# Patient Record
Sex: Male | Born: 1950
Health system: Southern US, Community
[De-identification: ages and names within clinical notes are randomized; demographics above are authoritative.]

## PROBLEM LIST (undated history)

## (undated) DIAGNOSIS — G4730 Sleep apnea, unspecified: Secondary | ICD-10-CM

## (undated) DIAGNOSIS — F419 Anxiety disorder, unspecified: Secondary | ICD-10-CM

## (undated) DIAGNOSIS — M1990 Unspecified osteoarthritis, unspecified site: Secondary | ICD-10-CM

## (undated) DIAGNOSIS — E785 Hyperlipidemia, unspecified: Secondary | ICD-10-CM

## (undated) DIAGNOSIS — I10 Essential (primary) hypertension: Secondary | ICD-10-CM

## (undated) DIAGNOSIS — F329 Major depressive disorder, single episode, unspecified: Secondary | ICD-10-CM

## (undated) DIAGNOSIS — F32A Depression, unspecified: Secondary | ICD-10-CM

## (undated) HISTORY — DX: Hyperlipidemia, unspecified: E78.5

## (undated) HISTORY — PX: KNEE ARTHROSCOPY: SHX127

## (undated) HISTORY — DX: Essential (primary) hypertension: I10

## (undated) HISTORY — PX: OTHER SURGICAL HISTORY: SHX169

---

## 1998-09-09 ENCOUNTER — Inpatient Hospital Stay (HOSPITAL_COMMUNITY): Admission: EM | Admit: 1998-09-09 | Discharge: 1998-09-11 | Payer: Self-pay

## 1998-09-09 ENCOUNTER — Encounter: Payer: Self-pay | Admitting: Internal Medicine

## 2000-10-23 ENCOUNTER — Emergency Department (HOSPITAL_COMMUNITY): Admission: EM | Admit: 2000-10-23 | Discharge: 2000-10-23 | Payer: Self-pay | Admitting: Emergency Medicine

## 2010-08-30 ENCOUNTER — Emergency Department (HOSPITAL_COMMUNITY)
Admission: EM | Admit: 2010-08-30 | Discharge: 2010-08-30 | Disposition: A | Discharge status: Home / Self Care | Payer: BC Managed Care – PPO | Attending: Emergency Medicine | Admitting: Emergency Medicine

## 2010-08-30 DIAGNOSIS — R319 Hematuria, unspecified: Secondary | ICD-10-CM | POA: Insufficient documentation

## 2010-08-30 DIAGNOSIS — R300 Dysuria: Secondary | ICD-10-CM | POA: Insufficient documentation

## 2010-08-30 DIAGNOSIS — Z0000 Encounter for general adult medical examination without abnormal findings: Secondary | ICD-10-CM | POA: Insufficient documentation

## 2010-08-30 DIAGNOSIS — F341 Dysthymic disorder: Secondary | ICD-10-CM | POA: Insufficient documentation

## 2010-08-30 DIAGNOSIS — I10 Essential (primary) hypertension: Secondary | ICD-10-CM | POA: Insufficient documentation

## 2010-08-30 LAB — CBC
HCT: 46.5 % (ref 39.0–52.0)
Hemoglobin: 15.9 g/dL (ref 13.0–17.0)
MCH: 29.3 pg (ref 26.0–34.0)
MCHC: 34.2 g/dL (ref 30.0–36.0)
MCV: 85.8 fL (ref 78.0–100.0)
Platelets: 345 10*3/uL (ref 150–400)
RBC: 5.42 MIL/uL (ref 4.22–5.81)
RDW: 13.8 % (ref 11.5–15.5)
WBC: 9.9 10*3/uL (ref 4.0–10.5)

## 2010-08-30 LAB — DIFFERENTIAL
Basophils Absolute: 0 10*3/uL (ref 0.0–0.1)
Basophils Relative: 0 % (ref 0–1)
Eosinophils Absolute: 0.1 10*3/uL (ref 0.0–0.7)
Eosinophils Relative: 1 % (ref 0–5)
Lymphocytes Relative: 30 % (ref 12–46)
Lymphs Abs: 3 10*3/uL (ref 0.7–4.0)
Monocytes Absolute: 0.7 10*3/uL (ref 0.1–1.0)
Monocytes Relative: 7 % (ref 3–12)
Neutro Abs: 6.1 10*3/uL (ref 1.7–7.7)
Neutrophils Relative %: 62 % (ref 43–77)

## 2010-08-30 LAB — URINALYSIS, ROUTINE W REFLEX MICROSCOPIC
Bilirubin Urine: NEGATIVE
Glucose, UA: NEGATIVE mg/dL
Hgb urine dipstick: NEGATIVE
Ketones, ur: 15 mg/dL — AB
Nitrite: NEGATIVE
Protein, ur: NEGATIVE mg/dL
Specific Gravity, Urine: 1.02 (ref 1.005–1.030)
Urobilinogen, UA: 1 mg/dL (ref 0.0–1.0)
pH: 5.5 (ref 5.0–8.0)

## 2010-08-30 LAB — BASIC METABOLIC PANEL
BUN: 8 mg/dL (ref 6–23)
CO2: 24 mEq/L (ref 19–32)
Calcium: 9.5 mg/dL (ref 8.4–10.5)
Chloride: 104 mEq/L (ref 96–112)
Creatinine, Ser: 1.23 mg/dL (ref 0.4–1.5)
GFR calc Af Amer: >60 mL/min (ref >60)
GFR calc non Af Amer: >60 mL/min (ref >60)
Glucose, Bld: 139 mg/dL — ABNORMAL HIGH (ref 70–99)
Potassium: 3.9 mEq/L (ref 3.5–5.1)
Sodium: 139 mEq/L (ref 135–145)

## 2011-12-14 ENCOUNTER — Encounter: Payer: BC Managed Care – PPO | Attending: Obstetrics and Gynecology | Admitting: *Deleted

## 2011-12-14 DIAGNOSIS — Z713 Dietary counseling and surveillance: Secondary | ICD-10-CM | POA: Insufficient documentation

## 2011-12-14 DIAGNOSIS — E119 Type 2 diabetes mellitus without complications: Secondary | ICD-10-CM | POA: Insufficient documentation

## 2011-12-15 ENCOUNTER — Encounter: Payer: Self-pay | Admitting: *Deleted

## 2011-12-15 NOTE — Progress Notes (Signed)
  Patient was seen on 12/14/2011 for the first of a series of three diabetes self-management courses at the [facility].  A1c on 8/13 = 6.9%  The following learning objectives were met by the patient during this course:   Defines the role of glucose and insulin  Identifies type of diabetes and pathophysiology  Defines the diagnostic criteria for diabetes and prediabetes  States the risk factors for Type 2 Diabetes  States the symptoms of Type 2 Diabetes  Defines Type 2 Diabetes treatment goals  Defines Type 2 Diabetes treatment options  States the rationale for glucose monitoring  Identifies A1C, glucose targets, and testing times  Identifies proper sharps disposal  Defines the purpose of a diabetes food plan  Identifies carbohydrate food groups  Defines effects of carbohydrate foods on glucose levels  Identifies carbohydrate choices/grams/food labels  States benefits of physical activity and effect on glucose  Review of suggested activity guidelines  Handouts given during class include:  Type 2 Diabetes: Basics Book  My Food Plan Book  Food and Activity Log  Follow-Up Plan: Core Class 2

## 2011-12-15 NOTE — Patient Instructions (Signed)
Goals:  Follow Diabetes Meal Plan as instructed  Eat 3 meals and 2 snacks, every 3-5 hrs  Limit carbohydrate intake to 60 grams carbohydrate/meal  Limit carbohydrate intake to 0-30 grams carbohydrate/snack  Add lean protein foods to meals/snacks  Monitor glucose levels as instructed by your doctor  Aim for 15-30 mins of physical activity daily as tolerated  Bring food record and glucose log to your next nutrition visit 

## 2012-01-04 ENCOUNTER — Encounter: Payer: BC Managed Care – PPO | Attending: Obstetrics and Gynecology | Admitting: *Deleted

## 2012-01-04 DIAGNOSIS — E119 Type 2 diabetes mellitus without complications: Secondary | ICD-10-CM

## 2012-01-04 DIAGNOSIS — Z713 Dietary counseling and surveillance: Secondary | ICD-10-CM | POA: Insufficient documentation

## 2012-01-05 ENCOUNTER — Encounter: Payer: Self-pay | Admitting: *Deleted

## 2012-01-05 NOTE — Progress Notes (Signed)
  Patient was seen on 01/04/12 for the second of a series of three diabetes self-management courses at the [facility]. The following learning objectives were met by the patient during this course:   Explain basic nutrition maintenance and quality assurance  Describe causes, symptoms and treatment of hypoglycemia and hyperglycemia  Explain how to manage diabetes during illness  Describe the importance of good nutrition for health and healthy eating strategies  List strategies to follow meal plan when dining out  Describe the effects of alcohol on glucose and how to use it safely  Describe problem solving skills for day-to-day glucose challenges  Describe strategies to use when treatment plan needs to change  Identify important factors involved in successful weight loss  Describe ways to remain physically active  Describe the impact of regular activity on insulin resistance   Handouts given in class:  Refrigerator magnet for Sick Day Guidelines  [facility] Oral medication/insulin handout  Follow-Up Plan: Patient will attend the final class of the ADA Diabetes Self-Care Education.   

## 2012-01-05 NOTE — Patient Instructions (Signed)
Goals:  Follow Diabetes Meal Plan as instructed  Eat 3 meals and 2 snacks, every 3-5 hrs  Limit carbohydrate intake to 45-60 grams carbohydrate/meal  Limit carbohydrate intake to 15-30 grams carbohydrate/snack  Add lean protein foods to meals/snacks  Monitor glucose levels as instructed by your doctor  Aim for 30 mins of physical activity daily  Bring food record and glucose log to your next nutrition visit 

## 2012-01-18 ENCOUNTER — Encounter: Payer: BC Managed Care – PPO | Admitting: Dietician

## 2012-01-18 DIAGNOSIS — E119 Type 2 diabetes mellitus without complications: Secondary | ICD-10-CM

## 2012-01-21 NOTE — Progress Notes (Signed)
  Patient was seen on 01/18/2012 for the third of a series of three diabetes self-management courses at the [facility]. The following learning objectives were met by the patient during this course:    Describe how diabetes changes over time   Identify diabetes complications and ways to prevent them   Describe strategies that can promote heart health including lowering blood pressure and cholesterol   Describe strategies to lower dietary fat and sodium in the diet   Identify physical activities that benefit cardiovascular health   Evaluate success in meeting personal goal   Describe the belief that they can live successfully with diabetes day to day   Establish 2-3 goals that they will plan to diligently work on until they return for the free 57-month follow-up visit  The following handouts were given in class:  3 Month Follow Up Visit handout  Goal setting handout  [facility] Stress Management Handout  Class evaluation form  Your patient has established the following 3 month goals for diabetes self-care:  Count Carbohydrates at most of my meals and snacks  Test my glucose 3 times a day 7 days a week   Increase my activity.  Follow-Up Plan: Patient will attend a 3 month follow-up visit for diabetes self-management education.

## 2012-04-17 ENCOUNTER — Ambulatory Visit: Payer: BC Managed Care – PPO | Admitting: Dietician

## 2012-04-18 ENCOUNTER — Ambulatory Visit: Payer: BC Managed Care – PPO | Admitting: Dietician

## 2013-10-04 ENCOUNTER — Ambulatory Visit (HOSPITAL_BASED_OUTPATIENT_CLINIC_OR_DEPARTMENT_OTHER): Payer: BC Managed Care – PPO | Attending: Family Medicine

## 2013-10-04 VITALS — Ht 72.0 in | Wt 240.0 lb

## 2013-10-04 DIAGNOSIS — G4710 Hypersomnia, unspecified: Secondary | ICD-10-CM | POA: Insufficient documentation

## 2013-10-04 DIAGNOSIS — G4733 Obstructive sleep apnea (adult) (pediatric): Secondary | ICD-10-CM

## 2013-10-04 DIAGNOSIS — R0989 Other specified symptoms and signs involving the circulatory and respiratory systems: Secondary | ICD-10-CM | POA: Insufficient documentation

## 2013-10-04 DIAGNOSIS — G4730 Sleep apnea, unspecified: Principal | ICD-10-CM

## 2013-10-04 DIAGNOSIS — R0609 Other forms of dyspnea: Secondary | ICD-10-CM | POA: Insufficient documentation

## 2013-10-04 DIAGNOSIS — I4949 Other premature depolarization: Secondary | ICD-10-CM | POA: Insufficient documentation

## 2013-10-04 DIAGNOSIS — G4761 Periodic limb movement disorder: Secondary | ICD-10-CM | POA: Insufficient documentation

## 2013-10-07 DIAGNOSIS — G4733 Obstructive sleep apnea (adult) (pediatric): Secondary | ICD-10-CM

## 2013-10-07 DIAGNOSIS — G4730 Sleep apnea, unspecified: Secondary | ICD-10-CM

## 2013-10-07 DIAGNOSIS — G4710 Hypersomnia, unspecified: Secondary | ICD-10-CM

## 2013-10-07 NOTE — Sleep Study (Signed)
   NAME: Timothy Hammond DATE OF BIRTH:  04-Jan-1951 MEDICAL RECORD NUMBER [id number]  [facility]  PHYSICIAN: Clinton D Young  DATE OF STUDY: 10/04/2013  SLEEP STUDY TYPE: Nocturnal Polysomnogram               REFERRING PHYSICIAN: Massie Maroon, FNP  INDICATION FOR STUDY: Hypersomnia with sleep apnea and  EPWORTH SLEEPINESS SCORE:   7/24 and HEIGHT: 6' (182.9 cm)  WEIGHT: 108.863 kg (240 lb)    Body mass index is 32.54 kg/(m^2).  NECK SIZE: 17 in.  MEDICATIONS: Charted for review  SLEEP ARCHITECTURE: Split study protocol. During the diagnostic phase, total sleep time 126 minutes with sleep efficiency 75.9%. Stage I was 18.3%, stage II 81.7%, stage III and REM were absent. Sleep latency 0.5 minutes, awake after sleep onset 39.5 minutes, arousal index 26.2, bedtime medication: Zolpidem. Sleep was fragmented by frequent brief intervals of awakening.  RESPIRATORY DATA: Apnea hypopneas index (AHI) 51.4 per hour. 108 total events scored including 48 obstructive apneas and 60 hypopneas. Events were not positional. CPAP was titrated to 11 CWP, AHI 0 per hour. He wore a Simplus fullface mask, size large, with heated humidifier.  OXYGEN DATA: Loud snoring before CPAP with oxygen desaturation to a nadir of 83% on room air. With CPAP control, snoring was prevented and mean oxygen saturation of 92.1% on room air.  CARDIAC DATA: Sinus rhythm with PVCs  MOVEMENT/PARASOMNIA: Before CPAP 67 limb jerks were counted of which 3 were associated with arousal or wakening for a periodic limb movement with arousal index of 1.4 per hour. During CPAP titration, 71 limb jerks were counted but none were noted to affect  sleep quality. Bathroom x2  IMPRESSION/ RECOMMENDATION:   1) Severe obstructive sleep apnea/hypopnea syndrome, AHI 51.4 per hour with non-positional events. Loud snoring with oxygen desaturation to a nadir of 83% on room air. 2) Successful CPAP titration to 11  CWP, AHI 0 per hour. He wore a large [employer] Simplus fullface mask with heated humidifier. Snoring was prevented and mean oxygen saturation held 92.1% on room air with CPAP.  Signed Jetty Duhamel M.D. Waymon Budge Diplomate, Biomedical engineer of Sleep Medicine  ELECTRONICALLY SIGNED ON:  10/07/2013, 4:05 PM  [facility] PH: [phone]   FX: [phone] ACCREDITED BY THE AMERICAN ACADEMY OF SLEEP MEDICINE

## 2014-09-26 ENCOUNTER — Other Ambulatory Visit: Payer: Self-pay | Admitting: Orthopaedic Surgery

## 2014-10-31 ENCOUNTER — Inpatient Hospital Stay (HOSPITAL_COMMUNITY): Admission: RE | Admit: 2014-10-31 | Payer: Self-pay | Source: Ambulatory Visit

## 2014-11-12 ENCOUNTER — Encounter (HOSPITAL_COMMUNITY): Admission: RE | Payer: Self-pay | Source: Ambulatory Visit

## 2014-11-12 ENCOUNTER — Inpatient Hospital Stay (HOSPITAL_COMMUNITY)
Admission: RE | Admit: 2014-11-12 | Payer: BLUE CROSS/BLUE SHIELD | Source: Ambulatory Visit | Admitting: Orthopaedic Surgery

## 2014-11-12 SURGERY — ARTHROPLASTY, KNEE, TOTAL
Anesthesia: Spinal | Laterality: Right

## 2015-01-30 ENCOUNTER — Other Ambulatory Visit: Payer: Self-pay | Admitting: Orthopaedic Surgery

## 2015-03-10 ENCOUNTER — Encounter (HOSPITAL_COMMUNITY): Payer: Self-pay

## 2015-03-10 ENCOUNTER — Encounter (HOSPITAL_COMMUNITY)
Admission: RE | Admit: 2015-03-10 | Discharge: 2015-03-10 | Disposition: A | Discharge status: Home / Self Care | Payer: Medicare Other | Source: Ambulatory Visit | Attending: Orthopaedic Surgery | Admitting: Orthopaedic Surgery

## 2015-03-10 DIAGNOSIS — Z01818 Encounter for other preprocedural examination: Secondary | ICD-10-CM | POA: Diagnosis present

## 2015-03-10 DIAGNOSIS — M179 Osteoarthritis of knee, unspecified: Secondary | ICD-10-CM | POA: Diagnosis not present

## 2015-03-10 HISTORY — DX: Sleep apnea, unspecified: G47.30

## 2015-03-10 HISTORY — DX: Major depressive disorder, single episode, unspecified: F32.9

## 2015-03-10 HISTORY — DX: Anxiety disorder, unspecified: F41.9

## 2015-03-10 HISTORY — DX: Unspecified osteoarthritis, unspecified site: M19.90

## 2015-03-10 HISTORY — DX: Depression, unspecified: F32.A

## 2015-03-10 LAB — URINALYSIS, ROUTINE W REFLEX MICROSCOPIC
BILIRUBIN URINE: NEGATIVE
GLUCOSE, UA: NEGATIVE mg/dL
HGB URINE DIPSTICK: NEGATIVE
Ketones, ur: NEGATIVE mg/dL
Leukocytes, UA: NEGATIVE
Nitrite: NEGATIVE
PROTEIN: NEGATIVE mg/dL
Specific Gravity, Urine: 1.017 (ref 1.005–1.030)
Urobilinogen, UA: 0.2 mg/dL (ref 0.0–1.0)
pH: 5 (ref 5.0–8.0)

## 2015-03-10 LAB — GLUCOSE, CAPILLARY: Glucose-Capillary: 183 mg/dL — ABNORMAL HIGH (ref 65–99)

## 2015-03-10 LAB — PROTIME-INR
INR: 1.09 (ref 0.00–1.49)
Prothrombin Time: 14.3 seconds (ref 11.6–15.2)

## 2015-03-10 LAB — CBC WITH DIFFERENTIAL/PLATELET
BASOS ABS: 0 10*3/uL (ref 0.0–0.1)
Basophils Relative: 0 %
Eosinophils Absolute: 0.3 10*3/uL (ref 0.0–0.7)
Eosinophils Relative: 3 %
HEMATOCRIT: 43.7 % (ref 39.0–52.0)
HEMOGLOBIN: 14.6 g/dL (ref 13.0–17.0)
LYMPHS PCT: 33 %
Lymphs Abs: 2.9 10*3/uL (ref 0.7–4.0)
MCH: 29.7 pg (ref 26.0–34.0)
MCHC: 33.4 g/dL (ref 30.0–36.0)
MCV: 89 fL (ref 78.0–100.0)
Monocytes Absolute: 0.6 10*3/uL (ref 0.1–1.0)
Monocytes Relative: 7 %
NEUTROS ABS: 5.2 10*3/uL (ref 1.7–7.7)
NEUTROS PCT: 57 %
PLATELETS: 349 10*3/uL (ref 150–400)
RBC: 4.91 MIL/uL (ref 4.22–5.81)
RDW: 13.8 % (ref 11.5–15.5)
WBC: 9 10*3/uL (ref 4.0–10.5)

## 2015-03-10 LAB — BASIC METABOLIC PANEL
ANION GAP: 9 (ref 5–15)
BUN: 13 mg/dL (ref 6–20)
CHLORIDE: 105 mmol/L (ref 101–111)
CO2: 27 mmol/L (ref 22–32)
Calcium: 9.5 mg/dL (ref 8.9–10.3)
Creatinine, Ser: 1.22 mg/dL (ref 0.61–1.24)
GFR calc Af Amer: >60 mL/min (ref >60)
Glucose, Bld: 165 mg/dL — ABNORMAL HIGH (ref 65–99)
POTASSIUM: 4.3 mmol/L (ref 3.5–5.1)
SODIUM: 141 mmol/L (ref 135–145)

## 2015-03-10 LAB — ABO/RH: ABO/RH(D): O POS

## 2015-03-10 LAB — APTT: APTT: 34 s (ref 24–37)

## 2015-03-10 LAB — TYPE AND SCREEN
ABO/RH(D): O POS
ANTIBODY SCREEN: NEGATIVE

## 2015-03-10 LAB — SURGICAL PCR SCREEN
MRSA, PCR: NEGATIVE
Staphylococcus aureus: POSITIVE — AB

## 2015-03-10 NOTE — Pre-Procedure Instructions (Signed)
Oretha Milchercy D Dempsey  03/10/2015      [facility] - [place] ([place]), [place] - [address] [place] [id number] Phone: [phone] Fax: [phone]    Your procedure is scheduled on Tues, Nov 15 @ 7:30 AM  [facility]  Call this number if you have problems the morning of surgery:  [phone]   Remember:  Do not eat food or drink liquids after midnight.  Take these medicines the morning of surgery with A SIP OF WATER Alprazolam(Xanax),Metoprolol(Toprol),and Paxil(Paroxetine)              No Goody's,BC's,Aleve,Aspirin,Ibuprofen,Motrin,Advil,Fish Oil,or any Herbal Medications.                  How to Manage Your Diabetes Before Surgery   Why is it important to control my blood sugar before and after surgery?   Improving blood sugar levels before and after surgery helps healing and can limit problems.  A way of improving blood sugar control is eating a healthy diet by:  - Eating less sugar and carbohydrates  - Increasing activity/exercise  - Talk with your doctor about reaching your blood sugar goals  High blood sugars (greater than 180 mg/dL) can raise your risk of infections and slow down your recovery so you will need to focus on controlling your diabetes during the weeks before surgery.  Make sure that the doctor who takes care of your diabetes knows about your planned surgery including the date and location.  How do I manage my blood sugars before surgery?   Check your blood sugar at least 4 times a day, 2 days before surgery to make sure that they are not too high or low.   Check your blood sugar the morning of your surgery when you wake up and every 2               hours until you get to the Short-Stay unit.  If your blood sugar is less than 70 mg/dL, you will need to treat for low blood sugar by:  Treat a low blood sugar (less than 70 mg/dL) with 1/2 cup of clear juice (cranberry  or apple), 4 glucose tablets, OR glucose gel.  Recheck blood sugar in 15 minutes after treatment (to make sure it is greater than 70 mg/dL).  If blood sugar is not greater than 70 mg/dL on re-check, call [phone] for further instructions.   Report your blood sugar to the Short-Stay nurse when you get to Short-Stay.  References:  [facility], 2007 "How to Manage your Diabetes Before and After Surgery".  What do I do about my diabetes medications?   Do not take oral diabetes medicines (pills) the morning of surgery.          Do not take other diabetes injectables the day of surgery including Byetta, Victoza, Bydureon, and Trulicity.    If your CBG is greater than 220 mg/dL, you may take 1/2 of your sliding scale (correction) dose of insulin.   For patients with "Insulin Pumps":  Contact your diabetes doctor for specific instructions before surgery.   Decrease basal insulin rates by 20% at midnight the night before surgery.  Note that if your surgery is planned to be longer than 2 hours, your insulin pump will be removed and intravenous (IV) insulin will be started and managed by the nurses and anesthesiologist.  You will be  able to restart your insulin pump once you are awake and able to manage it.  Make sure to bring insulin pump supplies to the hospital with you in case your site needs to be changed.         Do not wear jewelry, make-up or nail polish.  Do not wear lotions, powders, or perfumes.  You may wear deodorant.  Do not shave 48 hours prior to surgery.  Men may shave face and neck.  Do not bring valuables to the hospital.  [facility] is not responsible for any belongings or valuables.  Contacts, dentures or bridgework may not be worn into surgery.  Leave your suitcase in the car.  After surgery it may be brought to your room.  For patients admitted to the hospital, discharge time will be determined by your treatment  team.  Patients discharged the day of surgery will not be allowed to drive home.   Special instructions: [place] - Preparing for Surgery  Before surgery, you can play an important role.  Because skin is not sterile, your skin needs to be as free of germs as possible.  You can reduce the number of germs on you skin by washing with CHG (chlorahexidine gluconate) soap before surgery.  CHG is an antiseptic cleaner which kills germs and bonds with the skin to continue killing germs even after washing.  Please DO NOT use if you have an allergy to CHG or antibacterial soaps.  If your skin becomes reddened/irritated stop using the CHG and inform your nurse when you arrive at Short Stay.  Do not shave (including legs and underarms) for at least 48 hours prior to the first CHG shower.  You may shave your face.  Please follow these instructions carefully:   1.  Shower with CHG Soap the night before surgery and the                                morning of Surgery.  2.  If you choose to wash your hair, wash your hair first as usual with your       normal shampoo.  3.  After you shampoo, rinse your hair and body thoroughly to remove the                      Shampoo.  4.  Use CHG as you would any other liquid soap.  You can apply chg directly       to the skin and wash gently with scrungie or a clean washcloth.  5.  Apply the CHG Soap to your body ONLY FROM THE NECK DOWN.        Do not use on open wounds or open sores.  Avoid contact with your eyes,       ears, mouth and genitals (private parts).  Wash genitals (private parts)       with your normal soap.  6.  Wash thoroughly, paying special attention to the area where your surgery        will be performed.  7.  Thoroughly rinse your body with warm water from the neck down.  8.  DO NOT shower/wash with your normal soap after using and rinsing off       the CHG Soap.  9.  Pat yourself dry with a clean towel.            10.  Wear clean  pajamas.             11.  Place clean sheets on your bed the night of your first shower and do not        sleep with pets.  Day of Surgery  Do not apply any lotions/deoderants the morning of surgery.  Please wear clean clothes to the hospital/surgery center.    Please read over the following fact sheets that you were given. Pain Booklet, Coughing and Deep Breathing, Blood Transfusion Information, MRSA Information and Surgical Site Infection Prevention

## 2015-03-11 LAB — HEMOGLOBIN A1C
HEMOGLOBIN A1C: 6.7 % — AB (ref 4.8–5.6)
Mean Plasma Glucose: 146 mg/dL

## 2015-03-12 NOTE — H&P (Signed)
TOTAL KNEE ADMISSION H&P  Patient is being admitted for right total knee arthroplasty.  Subjective:  Chief Complaint:right knee pain.  HPI: Timothy Hammond, 64 y.o. male, has a history of pain and functional disability in the right knee due to arthritis and has failed non-surgical conservative treatments for greater than 12 weeks to includeNSAID's and/or analgesics, corticosteriod injections, viscosupplementation injections, flexibility and strengthening excercises, use of assistive devices, weight reduction as appropriate and activity modification.  Onset of symptoms was gradual, starting 5 years ago with gradually worsening course since that time. The patient noted no past surgery on the right knee(s).  Patient currently rates pain in the right knee(s) at 10 out of 10 with activity. Patient has night pain, worsening of pain with activity and weight bearing, pain that interferes with activities of daily living, crepitus and joint swelling.  Patient has evidence of subchondral cysts, subchondral sclerosis, periarticular osteophytes and joint space narrowing by imaging studies. There is no active infection.  There are no active problems to display for this patient.  Past Medical History  Diagnosis Date  . Hypertension   . Hyperlipidemia   . Diabetes mellitus   . Arthritis   . Anxiety   . Depression   . Sleep apnea     Past Surgical History  Procedure Laterality Date  . Knee arthroscopy    . Broke arm      as child    No prescriptions prior to admission   No Known Allergies  Social History  Substance Use Topics  . Smoking status: Never Smoker   . Smokeless tobacco: Never Used  . Alcohol Use: No    No family history on file.   Review of Systems  Musculoskeletal: Positive for joint pain.       Right knee  All other systems reviewed and are negative.   Objective:  Physical Exam  Constitutional: He is oriented to person, place, and time. He appears well-developed and  well-nourished.  HENT:  Head: Normocephalic and atraumatic.  Eyes: Pupils are equal, round, and reactive to light.  Neck: Normal range of motion.  Cardiovascular: Normal rate and regular rhythm.   Respiratory: Effort normal.  GI: Soft.  Musculoskeletal:  Right knee range of motion is about 5-100. He has some medial joint line pain and crepitation. There is no effusion.  Hip motion is full and pain free and SLR is negative on both sides.  There is no palpable LAD behind either knee.  Sensation and motor function are intact on both sides and there are palpable pulses on both sides.   Neurological: He is alert and oriented to person, place, and time.  Skin: Skin is warm and dry.  Psychiatric: He has a normal mood and affect. His behavior is normal. Judgment and thought content normal.    Vital signs in last 24 hours:    Labs:   Estimated body mass index is 32.54 kg/(m^2) as calculated from the following:   Height as of 10/04/13: 6' (1.829 m).   Weight as of 10/04/13: 108.863 kg (240 lb).   Imaging Review Plain radiographs demonstrate severe degenerative joint disease of the right knee(s). The overall alignment isneutral. The bone quality appears to be good for age and reported activity level.  Assessment/Plan:  End stage primary arthritis, right knee   The patient history, physical examination, clinical judgment of the provider and imaging studies are consistent with end stage degenerative joint disease of the right knee(s) and total knee arthroplasty is deemed  medically necessary. The treatment options including medical management, injection therapy arthroscopy and arthroplasty were discussed at length. The risks and benefits of total knee arthroplasty were presented and reviewed. The risks due to aseptic loosening, infection, stiffness, patella tracking problems, thromboembolic complications and other imponderables were discussed. The patient acknowledged the explanation, agreed to  proceed with the plan and consent was signed. Patient is being admitted for inpatient treatment for surgery, pain control, PT, OT, prophylactic antibiotics, VTE prophylaxis, progressive ambulation and ADL's and discharge planning. The patient is planning to be discharged home with home health services

## 2015-03-17 MED ORDER — CEFAZOLIN SODIUM-DEXTROSE 2-3 GM-% IV SOLR
2.0000 g | INTRAVENOUS | Status: AC
  Administered 2015-03-18: 2 g via INTRAVENOUS
  Filled 2015-03-17: qty 50

## 2015-03-18 ENCOUNTER — Encounter (HOSPITAL_COMMUNITY)
Admission: RE | Disposition: A | Discharge status: Home Health | Payer: Self-pay | Source: Ambulatory Visit | Attending: Orthopaedic Surgery

## 2015-03-18 ENCOUNTER — Inpatient Hospital Stay (HOSPITAL_COMMUNITY): Payer: Medicare Other | Admitting: Anesthesiology

## 2015-03-18 ENCOUNTER — Inpatient Hospital Stay (HOSPITAL_COMMUNITY)
Admission: RE | Admit: 2015-03-18 | Discharge: 2015-03-20 | DRG: 470 | Disposition: A | Discharge status: Home Health | Payer: Medicare Other | Source: Ambulatory Visit | Attending: Orthopaedic Surgery | Admitting: Orthopaedic Surgery

## 2015-03-18 ENCOUNTER — Encounter (HOSPITAL_COMMUNITY): Payer: Self-pay | Admitting: *Deleted

## 2015-03-18 DIAGNOSIS — M1711 Unilateral primary osteoarthritis, right knee: Principal | ICD-10-CM | POA: Diagnosis present

## 2015-03-18 DIAGNOSIS — E785 Hyperlipidemia, unspecified: Secondary | ICD-10-CM | POA: Diagnosis present

## 2015-03-18 DIAGNOSIS — G4730 Sleep apnea, unspecified: Secondary | ICD-10-CM | POA: Diagnosis present

## 2015-03-18 DIAGNOSIS — Z7984 Long term (current) use of oral hypoglycemic drugs: Secondary | ICD-10-CM

## 2015-03-18 DIAGNOSIS — E119 Type 2 diabetes mellitus without complications: Secondary | ICD-10-CM | POA: Diagnosis present

## 2015-03-18 DIAGNOSIS — I10 Essential (primary) hypertension: Secondary | ICD-10-CM | POA: Diagnosis present

## 2015-03-18 DIAGNOSIS — Z79899 Other long term (current) drug therapy: Secondary | ICD-10-CM | POA: Diagnosis not present

## 2015-03-18 HISTORY — PX: TOTAL KNEE ARTHROPLASTY: SHX125

## 2015-03-18 LAB — GLUCOSE, CAPILLARY
GLUCOSE-CAPILLARY: 115 mg/dL — AB (ref 65–99)
GLUCOSE-CAPILLARY: 126 mg/dL — AB (ref 65–99)
Glucose-Capillary: 131 mg/dL — ABNORMAL HIGH (ref 65–99)
Glucose-Capillary: 145 mg/dL — ABNORMAL HIGH (ref 65–99)
Glucose-Capillary: 161 mg/dL — ABNORMAL HIGH (ref 65–99)

## 2015-03-18 SURGERY — ARTHROPLASTY, KNEE, TOTAL
Anesthesia: Monitor Anesthesia Care | Site: Knee | Laterality: Right

## 2015-03-18 MED ORDER — GLYCOPYRROLATE 0.2 MG/ML IJ SOLN
INTRAMUSCULAR | Status: DC | PRN
  Administered 2015-03-18: 0.4 mg via INTRAVENOUS

## 2015-03-18 MED ORDER — BUPIVACAINE-EPINEPHRINE (PF) 0.25% -1:200000 IJ SOLN
INTRAMUSCULAR | Status: AC
  Filled 2015-03-18: qty 30

## 2015-03-18 MED ORDER — FENTANYL CITRATE (PF) 250 MCG/5ML IJ SOLN
INTRAMUSCULAR | Status: AC
  Filled 2015-03-18: qty 5

## 2015-03-18 MED ORDER — NIFEDIPINE ER OSMOTIC RELEASE 90 MG PO TB24
90.0000 mg | ORAL_TABLET | Freq: Every day | ORAL | Status: DC
  Administered 2015-03-19 – 2015-03-20 (×2): 90 mg via ORAL
  Filled 2015-03-18 (×4): qty 1

## 2015-03-18 MED ORDER — ACETAMINOPHEN 650 MG RE SUPP: 650.0000 mg | Freq: Four times a day (QID) | RECTAL | Status: DC | PRN

## 2015-03-18 MED ORDER — SODIUM CHLORIDE 0.9 % IR SOLN
Status: DC | PRN
  Administered 2015-03-18: 3000 mL

## 2015-03-18 MED ORDER — METHOCARBAMOL 500 MG PO TABS
500.0000 mg | ORAL_TABLET | Freq: Four times a day (QID) | ORAL | Status: DC | PRN
  Administered 2015-03-18 – 2015-03-20 (×6): 500 mg via ORAL
  Filled 2015-03-18 (×5): qty 1

## 2015-03-18 MED ORDER — ONDANSETRON HCL 4 MG PO TABS: 4.0000 mg | ORAL_TABLET | Freq: Four times a day (QID) | ORAL | Status: DC | PRN

## 2015-03-18 MED ORDER — HYDROMORPHONE HCL 1 MG/ML IJ SOLN
0.2500 mg | INTRAMUSCULAR | Status: DC | PRN
  Administered 2015-03-18: 1 mg via INTRAVENOUS

## 2015-03-18 MED ORDER — BUPIVACAINE LIPOSOME 1.3 % IJ SUSP
INTRAMUSCULAR | Status: DC | PRN
  Administered 2015-03-18: 20 mL

## 2015-03-18 MED ORDER — MENTHOL 3 MG MT LOZG: 1.0000 | LOZENGE | OROMUCOSAL | Status: DC | PRN

## 2015-03-18 MED ORDER — CHLORHEXIDINE GLUCONATE 4 % EX LIQD: 60.0000 mL | Freq: Once | CUTANEOUS | Status: DC

## 2015-03-18 MED ORDER — TRANEXAMIC ACID 1000 MG/10ML IV SOLN
1000.0000 mg | INTRAVENOUS | Status: AC
  Administered 2015-03-18: 1000 mg via INTRAVENOUS
  Filled 2015-03-18: qty 10

## 2015-03-18 MED ORDER — BUPIVACAINE IN DEXTROSE 0.75-8.25 % IT SOLN
INTRATHECAL | Status: DC | PRN
  Administered 2015-03-18: 2 mL via INTRATHECAL

## 2015-03-18 MED ORDER — ZOLPIDEM TARTRATE 5 MG PO TABS: 5.0000 mg | ORAL_TABLET | Freq: Every evening | ORAL | Status: DC | PRN

## 2015-03-18 MED ORDER — LIDOCAINE HCL (CARDIAC) 20 MG/ML IV SOLN
INTRAVENOUS | Status: DC | PRN
  Administered 2015-03-18: 100 mg via INTRAVENOUS

## 2015-03-18 MED ORDER — 0.9 % SODIUM CHLORIDE (POUR BTL) OPTIME
TOPICAL | Status: DC | PRN
  Administered 2015-03-18: 1000 mL

## 2015-03-18 MED ORDER — ONDANSETRON HCL 4 MG/2ML IJ SOLN
INTRAMUSCULAR | Status: DC | PRN
  Administered 2015-03-18: 4 mg via INTRAVENOUS

## 2015-03-18 MED ORDER — PROPOFOL 10 MG/ML IV BOLUS
INTRAVENOUS | Status: AC
  Filled 2015-03-18: qty 20

## 2015-03-18 MED ORDER — DEXAMETHASONE SODIUM PHOSPHATE 4 MG/ML IJ SOLN
INTRAMUSCULAR | Status: DC | PRN
  Administered 2015-03-18: 4 mg via INTRAVENOUS

## 2015-03-18 MED ORDER — DOCUSATE SODIUM 100 MG PO CAPS
100.0000 mg | ORAL_CAPSULE | Freq: Two times a day (BID) | ORAL | Status: DC
  Administered 2015-03-18 – 2015-03-20 (×4): 100 mg via ORAL
  Filled 2015-03-18 (×4): qty 1

## 2015-03-18 MED ORDER — FENTANYL CITRATE (PF) 250 MCG/5ML IJ SOLN
INTRAMUSCULAR | Status: DC | PRN
  Administered 2015-03-18 (×3): 25 ug via INTRAVENOUS

## 2015-03-18 MED ORDER — SUCCINYLCHOLINE CHLORIDE 20 MG/ML IJ SOLN
INTRAMUSCULAR | Status: AC
  Filled 2015-03-18: qty 1

## 2015-03-18 MED ORDER — OXYCODONE HCL 5 MG PO TABS: 5.0000 mg | ORAL_TABLET | Freq: Once | ORAL | Status: DC | PRN

## 2015-03-18 MED ORDER — BUPIVACAINE LIPOSOME 1.3 % IJ SUSP
20.0000 mL | INTRAMUSCULAR | Status: DC
  Filled 2015-03-18: qty 20

## 2015-03-18 MED ORDER — ALUM & MAG HYDROXIDE-SIMETH 200-200-20 MG/5ML PO SUSP: 30.0000 mL | ORAL | Status: DC | PRN

## 2015-03-18 MED ORDER — ACETAMINOPHEN 325 MG PO TABS: 650.0000 mg | ORAL_TABLET | Freq: Four times a day (QID) | ORAL | Status: DC | PRN

## 2015-03-18 MED ORDER — METOPROLOL SUCCINATE ER 50 MG PO TB24
50.0000 mg | ORAL_TABLET | Freq: Every day | ORAL | Status: DC
  Administered 2015-03-19 – 2015-03-20 (×2): 50 mg via ORAL
  Filled 2015-03-18 (×2): qty 1

## 2015-03-18 MED ORDER — METHOCARBAMOL 500 MG PO TABS
ORAL_TABLET | ORAL | Status: AC
  Filled 2015-03-18: qty 1

## 2015-03-18 MED ORDER — BUPIVACAINE-EPINEPHRINE (PF) 0.25% -1:200000 IJ SOLN
INTRAMUSCULAR | Status: DC | PRN
  Administered 2015-03-18: 20 mL via PERINEURAL

## 2015-03-18 MED ORDER — PROPOFOL 500 MG/50ML IV EMUL
INTRAVENOUS | Status: DC | PRN
  Administered 2015-03-18: 75 ug/kg/min via INTRAVENOUS

## 2015-03-18 MED ORDER — PAROXETINE HCL 20 MG PO TABS
60.0000 mg | ORAL_TABLET | Freq: Every day | ORAL | Status: DC
  Administered 2015-03-19 – 2015-03-20 (×2): 60 mg via ORAL
  Filled 2015-03-18 (×2): qty 3

## 2015-03-18 MED ORDER — HYDROCODONE-ACETAMINOPHEN 5-325 MG PO TABS
1.0000 | ORAL_TABLET | ORAL | Status: DC | PRN
  Administered 2015-03-18 – 2015-03-19 (×5): 2 via ORAL
  Filled 2015-03-18 (×5): qty 2

## 2015-03-18 MED ORDER — PHENOL 1.4 % MT LIQD: 1.0000 | OROMUCOSAL | Status: DC | PRN

## 2015-03-18 MED ORDER — STERILE WATER FOR INJECTION IJ SOLN
INTRAMUSCULAR | Status: AC
  Filled 2015-03-18: qty 10

## 2015-03-18 MED ORDER — METOCLOPRAMIDE HCL 5 MG/ML IJ SOLN: 5.0000 mg | Freq: Three times a day (TID) | INTRAMUSCULAR | Status: DC | PRN

## 2015-03-18 MED ORDER — HYDROCODONE-ACETAMINOPHEN 5-325 MG PO TABS
ORAL_TABLET | ORAL | Status: AC
  Filled 2015-03-18: qty 2

## 2015-03-18 MED ORDER — DIPHENHYDRAMINE HCL 12.5 MG/5ML PO ELIX: 12.5000 mg | ORAL_SOLUTION | ORAL | Status: DC | PRN

## 2015-03-18 MED ORDER — LACTATED RINGERS IV SOLN
INTRAVENOUS | Status: DC
  Administered 2015-03-18 (×2): via INTRAVENOUS

## 2015-03-18 MED ORDER — NIFEDIPINE 10 MG PO CAPS: 20.0000 mg | ORAL_CAPSULE | Freq: Every morning | ORAL | Status: DC

## 2015-03-18 MED ORDER — ALPRAZOLAM 0.5 MG PO TABS: 0.5000 mg | ORAL_TABLET | Freq: Every evening | ORAL | Status: DC | PRN

## 2015-03-18 MED ORDER — BISACODYL 5 MG PO TBEC: 5.0000 mg | DELAYED_RELEASE_TABLET | Freq: Every day | ORAL | Status: DC | PRN

## 2015-03-18 MED ORDER — PRAVASTATIN SODIUM 40 MG PO TABS
40.0000 mg | ORAL_TABLET | Freq: Every day | ORAL | Status: DC
  Administered 2015-03-18 – 2015-03-19 (×2): 40 mg via ORAL
  Filled 2015-03-18 (×2): qty 1

## 2015-03-18 MED ORDER — OXYCODONE HCL 5 MG/5ML PO SOLN: 5.0000 mg | Freq: Once | ORAL | Status: DC | PRN

## 2015-03-18 MED ORDER — MIDAZOLAM HCL 2 MG/2ML IJ SOLN
INTRAMUSCULAR | Status: AC
  Filled 2015-03-18: qty 4

## 2015-03-18 MED ORDER — MIDAZOLAM HCL 2 MG/2ML IJ SOLN
INTRAMUSCULAR | Status: DC | PRN
  Administered 2015-03-18: 2 mg via INTRAVENOUS

## 2015-03-18 MED ORDER — METHOCARBAMOL 1000 MG/10ML IJ SOLN
500.0000 mg | Freq: Four times a day (QID) | INTRAVENOUS | Status: DC | PRN
  Filled 2015-03-18: qty 5

## 2015-03-18 MED ORDER — ONDANSETRON HCL 4 MG/2ML IJ SOLN: 4.0000 mg | Freq: Four times a day (QID) | INTRAMUSCULAR | Status: DC | PRN

## 2015-03-18 MED ORDER — METOCLOPRAMIDE HCL 5 MG PO TABS: 5.0000 mg | ORAL_TABLET | Freq: Three times a day (TID) | ORAL | Status: DC | PRN

## 2015-03-18 MED ORDER — INSULIN ASPART 100 UNIT/ML ~~LOC~~ SOLN
0.0000 [IU] | Freq: Three times a day (TID) | SUBCUTANEOUS | Status: DC
  Administered 2015-03-18: 4 [IU] via SUBCUTANEOUS
  Administered 2015-03-18 – 2015-03-19 (×2): 3 [IU] via SUBCUTANEOUS
  Administered 2015-03-19 (×2): 4 [IU] via SUBCUTANEOUS
  Administered 2015-03-20: 3 [IU] via SUBCUTANEOUS
  Administered 2015-03-20: 4 [IU] via SUBCUTANEOUS

## 2015-03-18 MED ORDER — HYDROMORPHONE HCL 1 MG/ML IJ SOLN
INTRAMUSCULAR | Status: AC
  Filled 2015-03-18: qty 1

## 2015-03-18 MED ORDER — LACTATED RINGERS IV SOLN
INTRAVENOUS | Status: DC
  Administered 2015-03-18: 19:00:00 via INTRAVENOUS

## 2015-03-18 MED ORDER — PROMETHAZINE HCL 25 MG/ML IJ SOLN: 6.2500 mg | INTRAMUSCULAR | Status: DC | PRN

## 2015-03-18 MED ORDER — EPHEDRINE SULFATE 50 MG/ML IJ SOLN
INTRAMUSCULAR | Status: AC
  Filled 2015-03-18: qty 1

## 2015-03-18 MED ORDER — METFORMIN HCL 500 MG PO TABS
500.0000 mg | ORAL_TABLET | Freq: Two times a day (BID) | ORAL | Status: DC
  Administered 2015-03-18 – 2015-03-20 (×4): 500 mg via ORAL
  Filled 2015-03-18 (×4): qty 1

## 2015-03-18 MED ORDER — ASPIRIN EC 325 MG PO TBEC
325.0000 mg | DELAYED_RELEASE_TABLET | Freq: Two times a day (BID) | ORAL | Status: DC
  Administered 2015-03-19 – 2015-03-20 (×3): 325 mg via ORAL
  Filled 2015-03-18 (×3): qty 1

## 2015-03-18 MED ORDER — CEFAZOLIN SODIUM-DEXTROSE 2-3 GM-% IV SOLR
2.0000 g | Freq: Four times a day (QID) | INTRAVENOUS | Status: AC
  Administered 2015-03-18 – 2015-03-19 (×2): 2 g via INTRAVENOUS
  Filled 2015-03-18 (×5): qty 50

## 2015-03-18 MED ORDER — ROCURONIUM BROMIDE 50 MG/5ML IV SOLN
INTRAVENOUS | Status: AC
  Filled 2015-03-18: qty 1

## 2015-03-18 MED ORDER — HYDROMORPHONE HCL 1 MG/ML IJ SOLN
0.5000 mg | INTRAMUSCULAR | Status: DC | PRN
  Administered 2015-03-18 – 2015-03-19 (×4): 1 mg via INTRAVENOUS
  Filled 2015-03-18 (×4): qty 1

## 2015-03-18 SURGICAL SUPPLY — 66 items
APL SKNCLS STERI-STRIP NONHPOA (GAUZE/BANDAGES/DRESSINGS)
BAG DECANTER FOR FLEXI CONT (MISCELLANEOUS) IMPLANT
BANDAGE ELASTIC 4 VELCRO ST LF (GAUZE/BANDAGES/DRESSINGS) ×1 IMPLANT
BANDAGE ESMARK 6X9 LF (GAUZE/BANDAGES/DRESSINGS) ×1 IMPLANT
BENZOIN TINCTURE PRP APPL 2/3 (GAUZE/BANDAGES/DRESSINGS) ×1 IMPLANT
BLADE SAGITTAL 25.0X1.19X90 (BLADE) ×1 IMPLANT
BLADE SAW SGTL 13.0X1.19X90.0M (BLADE) IMPLANT
BLADE SURG ROTATE 9660 (MISCELLANEOUS) IMPLANT
BNDG CMPR 9X6 STRL LF SNTH (GAUZE/BANDAGES/DRESSINGS) ×1
BNDG CMPR MED 10X6 ELC LF (GAUZE/BANDAGES/DRESSINGS) ×1
BNDG ELASTIC 6X10 VLCR STRL LF (GAUZE/BANDAGES/DRESSINGS) ×2 IMPLANT
BNDG ESMARK 6X9 LF (GAUZE/BANDAGES/DRESSINGS) ×2
BNDG GAUZE ELAST 4 BULKY (GAUZE/BANDAGES/DRESSINGS) ×4 IMPLANT
BOWL SMART MIX CTS (DISPOSABLE) ×2 IMPLANT
CAP KNEE TOTAL 3 SIGMA ×1 IMPLANT
CEMENT HV SMART SET (Cement) ×4 IMPLANT
COVER SURGICAL LIGHT HANDLE (MISCELLANEOUS) ×2 IMPLANT
CUFF TOURNIQUET SINGLE 34IN LL (TOURNIQUET CUFF) ×2 IMPLANT
CUFF TOURNIQUET SINGLE 44IN (TOURNIQUET CUFF) IMPLANT
DECANTER SPIKE VIAL GLASS SM (MISCELLANEOUS) ×1 IMPLANT
DRAPE EXTREMITY T 121X128X90 (DRAPE) ×2 IMPLANT
DRAPE PROXIMA HALF (DRAPES) ×2 IMPLANT
DRAPE U-SHAPE 47X51 STRL (DRAPES) ×2 IMPLANT
DRSG ADAPTIC 3X8 NADH LF (GAUZE/BANDAGES/DRESSINGS) ×2 IMPLANT
DRSG PAD ABDOMINAL 8X10 ST (GAUZE/BANDAGES/DRESSINGS) ×3 IMPLANT
DURAPREP 26ML APPLICATOR (WOUND CARE) ×2 IMPLANT
ELECT CAUTERY BLADE 6.4 (BLADE) ×1 IMPLANT
ELECT REM PT RETURN 9FT ADLT (ELECTROSURGICAL) ×2
ELECTRODE REM PT RTRN 9FT ADLT (ELECTROSURGICAL) ×1 IMPLANT
GAUZE SPONGE 4X4 12PLY STRL (GAUZE/BANDAGES/DRESSINGS) ×2 IMPLANT
GLOVE BIO SURGEON STRL SZ8 (GLOVE) ×4 IMPLANT
GLOVE BIOGEL PI IND STRL 8 (GLOVE) ×2 IMPLANT
GLOVE BIOGEL PI INDICATOR 8 (GLOVE) ×2
GOWN STRL REUS W/ TWL LRG LVL3 (GOWN DISPOSABLE) ×1 IMPLANT
GOWN STRL REUS W/ TWL XL LVL3 (GOWN DISPOSABLE) ×2 IMPLANT
GOWN STRL REUS W/TWL LRG LVL3 (GOWN DISPOSABLE) ×2
GOWN STRL REUS W/TWL XL LVL3 (GOWN DISPOSABLE) ×4
HANDPIECE INTERPULSE COAX TIP (DISPOSABLE) ×2
HOOD PEEL AWAY FACE SHEILD DIS (HOOD) ×4 IMPLANT
IMMOBILIZER KNEE 22 UNIV (SOFTGOODS) ×2 IMPLANT
KIT BASIN OR (CUSTOM PROCEDURE TRAY) ×2 IMPLANT
KIT ROOM TURNOVER OR (KITS) ×2 IMPLANT
MANIFOLD NEPTUNE II (INSTRUMENTS) ×2 IMPLANT
NDL HYPO 21X1 ECLIPSE (NEEDLE) ×1 IMPLANT
NEEDLE 22X1 1/2 (OR ONLY) (NEEDLE) ×1 IMPLANT
NEEDLE HYPO 21X1 ECLIPSE (NEEDLE) IMPLANT
NS IRRIG 1000ML POUR BTL (IV SOLUTION) ×2 IMPLANT
PACK TOTAL JOINT (CUSTOM PROCEDURE TRAY) ×2 IMPLANT
PACK UNIVERSAL I (CUSTOM PROCEDURE TRAY) ×2 IMPLANT
PAD ARMBOARD 7.5X6 YLW CONV (MISCELLANEOUS) ×4 IMPLANT
SET HNDPC FAN SPRY TIP SCT (DISPOSABLE) ×1 IMPLANT
STAPLER VISISTAT 35W (STAPLE) IMPLANT
STRIP CLOSURE SKIN 1/2X4 (GAUZE/BANDAGES/DRESSINGS) ×2 IMPLANT
SUCTION FRAZIER TIP 10 FR DISP (SUCTIONS) ×1 IMPLANT
SUT MNCRL AB 3-0 PS2 18 (SUTURE) ×1 IMPLANT
SUT VIC AB 0 CT1 27 (SUTURE) ×2
SUT VIC AB 0 CT1 27XBRD ANBCTR (SUTURE) ×2 IMPLANT
SUT VIC AB 2-0 CT1 27 (SUTURE) ×4
SUT VIC AB 2-0 CT1 TAPERPNT 27 (SUTURE) ×2 IMPLANT
SUT VLOC 180 0 24IN GS25 (SUTURE) ×2 IMPLANT
SYR 50ML LL SCALE MARK (SYRINGE) ×2 IMPLANT
TOWEL OR 17X24 6PK STRL BLUE (TOWEL DISPOSABLE) ×2 IMPLANT
TOWEL OR 17X26 10 PK STRL BLUE (TOWEL DISPOSABLE) ×2 IMPLANT
TRAY FOLEY CATH 14FR (SET/KITS/TRAYS/PACK) IMPLANT
UPCHARGE REV TRAY MBT KNEE ×1 IMPLANT
WATER STERILE IRR 1000ML POUR (IV SOLUTION) ×2 IMPLANT

## 2015-03-18 NOTE — Transfer of Care (Signed)
Immediate Anesthesia Transfer of Care Note  Patient: Timothy Hammond  Procedure(s) Performed: Procedure(s): TOTAL KNEE ARTHROPLASTY (Right)  Patient Location: PACU  Anesthesia Type:General and Spinal  Level of Consciousness: awake and alert   Airway & Oxygen Therapy: Patient Spontanous Breathing and Patient connected to nasal cannula oxygen  Post-op Assessment: Report given to RN and Post -op Vital signs reviewed and stable  Post vital signs: Reviewed and stable  Last Vitals:  Filed Vitals:   03/18/15 0549  BP: 109/67  Pulse: 66  Temp: 37.3 C  Resp: 14    Complications: No apparent anesthesia complications

## 2015-03-18 NOTE — Progress Notes (Signed)
Orthopedic Tech Progress Note Patient Details:  Timothy Hammond [birth year] [id number] On cpm at 6:30 pm Patient ID: Timothy Hammond, male   DOB: [birth year], 64 y.o.   MRN: [id number]   [name], [name] 03/18/2015, 6:27 PM

## 2015-03-18 NOTE — Anesthesia Procedure Notes (Addendum)
Spinal Patient location during procedure: OR Staffing Anesthesiologist: Suzette Battiest Performed by: anesthesiologist  Preanesthetic Checklist Completed: patient identified, site marked, surgical consent, pre-op evaluation, timeout performed, IV checked, risks and benefits discussed and monitors and equipment checked Spinal Block Patient position: sitting Prep: DuraPrep Patient monitoring: heart rate, continuous pulse ox and blood pressure Approach: midline Location: L4-5 Injection technique: single-shot Needle Needle type: Pencan  Needle gauge: 24 G Needle length: 9 cm Assessment Events: failed spinal Additional Notes Expiration date of kit checked and confirmed. Patient tolerated procedure well, without complications.    Procedure Name: LMA Insertion Date/Time: 03/18/2015 8:02 AM Performed by: Ollen Bowl Pre-anesthesia Checklist: Patient identified, Emergency Drugs available, Suction available, Patient being monitored and Timeout performed Patient Re-evaluated:Patient Re-evaluated prior to inductionOxygen Delivery Method: Circle system utilized and Simple face mask Preoxygenation: Pre-oxygenation with 100% oxygen Intubation Type: Combination inhalational/ intravenous induction Ventilation: Mask ventilation without difficulty LMA: LMA with gastric port inserted LMA Size: 5.0 Number of attempts: 1 Airway Equipment and Method: Patient positioned with wedge pillow Placement Confirmation: positive ETCO2 and breath sounds checked- equal and bilateral Tube secured with: Tape Dental Injury: Teeth and Oropharynx as per pre-operative assessment

## 2015-03-18 NOTE — Anesthesia Preprocedure Evaluation (Addendum)
Anesthesia Evaluation  Patient identified by MRN, date of birth, ID band Patient awake    Reviewed: Allergy & Precautions, NPO status , Patient's Chart, lab work & pertinent test results, reviewed documented beta blocker date and time   Airway Mallampati: II  TM Distance: >3 FB Neck ROM: Full    Dental  (+) Teeth Intact, Dental Advisory Given   Pulmonary sleep apnea ,    breath sounds clear to auscultation       Cardiovascular hypertension, Pt. on medications and Pt. on home beta blockers  Rhythm:Regular Rate:Normal     Neuro/Psych Anxiety Depression negative neurological ROS     GI/Hepatic negative GI ROS, Neg liver ROS,   Endo/Other  diabetes, Type 2, Oral Hypoglycemic Agents  Renal/GU negative Renal ROS     Musculoskeletal  (+) Arthritis ,   Abdominal   Peds  Hematology negative hematology ROS (+)   Anesthesia Other Findings   Reproductive/Obstetrics                           Lab Results  Component Value Date   WBC 9.0 03/10/2015   HGB 14.6 03/10/2015   HCT 43.7 03/10/2015   MCV 89.0 03/10/2015   PLT 349 03/10/2015   Lab Results  Component Value Date   CREATININE 1.22 03/10/2015   BUN 13 03/10/2015   NA 141 03/10/2015   K 4.3 03/10/2015   CL 105 03/10/2015   CO2 27 03/10/2015   Lab Results  Component Value Date   INR 1.09 03/10/2015    Anesthesia Physical Anesthesia Plan  ASA: II  Anesthesia Plan: Spinal and MAC   Post-op Pain Management:    Induction: Intravenous  Airway Management Planned: Simple Face Mask and Natural Airway  Additional Equipment:   Intra-op Plan:   Post-operative Plan:   Informed Consent: I have reviewed the patients History and Physical, chart, labs and discussed the procedure including the risks, benefits and alternatives for the proposed anesthesia with the patient or authorized representative who has indicated his/her understanding and  acceptance.     Plan Discussed with: CRNA  Anesthesia Plan Comments:        Anesthesia Quick Evaluation

## 2015-03-18 NOTE — Evaluation (Signed)
Physical Therapy Evaluation Patient Details Name: Timothy Hammond MRN: [id number] DOB: 12/05/1950 Today's Date: 03/18/2015   History of Present Illness  Patient is a 64 y/o male s/p Rt TKA. PMH includes HTN, HLD, DM, depression, sleep apnea.  Clinical Impression  Patient presents with pain, nausea and post surgical deficits RLE s/p R TKA. Tolerated short distance ambulation with Min guard assist for safety. Pt independent PTA. Instructed pt in exercises. Plans to go home with support from son. Will follow acutely to maximize independence and mobility prior to return home.    Follow Up Recommendations Home health PT;Supervision/Assistance - 24 hour    Equipment Recommendations  None recommended by PT    Recommendations for Other Services OT consult     Precautions / Restrictions Precautions Precautions: Knee Precaution Booklet Issued: No Precaution Comments: Reviewed no pillow under knee and precautions Restrictions Weight Bearing Restrictions: Yes RLE Weight Bearing: Weight bearing as tolerated      Mobility  Bed Mobility Overal bed mobility: Needs Assistance Bed Mobility: Supine to Sit     Supine to sit: Supervision;HOB elevated     General bed mobility comments: Use of rails for support. No physical assist needed. + nausea.  Transfers Overall transfer level: Needs assistance Equipment used: Rolling walker (2 wheeled) Transfers: Sit to/from Stand Sit to Stand: Min assist         General transfer comment: Min A to boost from EOB with cues for hand placement/technique. Transferred to chair post ambulation bout.  Ambulation/Gait Ambulation/Gait assistance: Min guard Ambulation Distance (Feet): 8 Feet Assistive device: Rolling walker (2 wheeled) Gait Pattern/deviations: Decreased stride length;Step-to pattern;Trunk flexed Gait velocity: decreased Gait velocity interpretation: <1.8 ft/sec, indicative of risk for recurrent falls General Gait Details: SLow,  steady gait with increased WB through BUEs to offload RLE. + nausea.  Stairs            Wheelchair Mobility    Modified Rankin (Stroke Patients Only)       Balance Overall balance assessment: Needs assistance Sitting-balance support: Feet supported;No upper extremity supported Sitting balance-Leahy Scale: Good     Standing balance support: During functional activity Standing balance-Leahy Scale: Poor Standing balance comment: Relient on RW for support.                              Pertinent Vitals/Pain Pain Assessment: Faces Faces Pain Scale: Hurts little more Pain Location: right knee Pain Descriptors / Indicators: Sore Pain Intervention(s): Monitored during session;Repositioned    Home Living Family/patient expects to be discharged to:: Private residence Living Arrangements: Children Available Help at Discharge: Family;Available 24 hours/day (son) Type of Home: House Home Access: Stairs to enter Entrance Stairs-Rails: Right Entrance Stairs-Number of Steps: 6 Home Layout: One level Home Equipment: Walker - 2 wheels;Bedside commode      Prior Function Level of Independence: Independent         Comments: Delivers parts for jets, drives a lot     Hand Dominance        Extremity/Trunk Assessment   Upper Extremity Assessment: Defer to OT evaluation           Lower Extremity Assessment: RLE deficits/detail RLE Deficits / Details: Limited AROM/strength secondary to pain/surgery       Communication   Communication: No difficulties  Cognition Arousal/Alertness: Awake/alert Behavior During Therapy: WFL for tasks assessed/performed Overall Cognitive Status: Within Functional Limits for tasks assessed  General Comments      Exercises Total Joint Exercises Ankle Circles/Pumps: Both;Supine;10 reps Quad Sets: Both;10 reps;Supine Gluteal Sets: Both;10 reps;Seated      Assessment/Plan    PT  Assessment Patient needs continued PT services  PT Diagnosis Difficulty walking;Generalized weakness;Acute pain   PT Problem List Decreased strength;Pain;Decreased activity tolerance;Decreased range of motion;Impaired sensation;Decreased balance;Decreased mobility  PT Treatment Interventions Balance training;Gait training;Stair training;Functional mobility training;Therapeutic activities;Therapeutic exercise;Patient/family education   PT Goals (Current goals can be found in the Care Plan section) Acute Rehab PT Goals Patient Stated Goal: to return to independence PT Goal Formulation: With patient Time For Goal Achievement: 04/01/15 Potential to Achieve Goals: Good    Frequency 7X/week   Barriers to discharge Inaccessible home environment 6 steps to enter home    Co-evaluation               End of Session Equipment Utilized During Treatment: Gait belt Activity Tolerance: Patient tolerated treatment well Patient left: in chair;with call bell/phone within reach Nurse Communication: Mobility status         Time: 1311-1330 PT Time Calculation (min) (ACUTE ONLY): 19 min   Charges:   PT Evaluation $Initial PT Evaluation Tier I: 1 Procedure     PT G Codes:        Nicha Hemann A Errol Ala 03/18/2015, 1:47 PM Mylo RedShauna Breyah Akhter, PT, DPT [phone]

## 2015-03-18 NOTE — Op Note (Signed)
PREOP DIAGNOSIS: DJD RIGHT KNEE POSTOP DIAGNOSIS: same PROCEDURE: RIGHT TKR ANESTHESIA: General ATTENDING SURGEON: Rawad Bochicchio G ASSISTANT: Elodia Florence PA  INDICATIONS FOR PROCEDURE: Timothy Hammond is a 64 y.o. male who has struggled for a long time with pain due to degenerative arthritis of the right knee.  The patient has failed many conservative non-operative measures and at this point has pain which limits the ability to sleep and walk.  The patient is offered total knee replacement.  Informed operative consent was obtained after discussion of possible risks of anesthesia, infection, neurovascular injury, DVT, and death.  The importance of the post-operative rehabilitation protocol to optimize result was stressed extensively with the patient.  SUMMARY OF FINDINGS AND PROCEDURE:  Timothy Hammond was taken to the operative suite where under the above anesthesia a right knee replacement was performed.  There were advanced degenerative changes and the bone quality was excellent.  We used the DePuy LCS system and placed size large femur, 6 MBT revision tibia, 38 mm all polyethylene patella, and a size 10 mm spacer.  Elodia Florence PA-C assisted throughout and was invaluable to the completion of the case in that he helped retract and maintain exposure while I placed components.  He also helped close thereby minimizing OR time.  The patient was admitted for appropriate post-op care to include perioperative antibiotics and mechanical and pharmacologic measures for DVT prophylaxis.  DESCRIPTION OF PROCEDURE:  Timothy Hammond was taken to the operative suite where the above anesthesia was applied.  The patient was positioned supine and prepped and draped in normal sterile fashion.  An appropriate time out was performed.  After the administration of kefzol pre-op antibiotic the leg was elevated and exsanguinated and a tourniquet inflated. A standard longitudinal incision was made on the anterior knee.   Dissection was carried down to the extensor mechanism.  All appropriate anti-infective measures were used including the pre-operative antibiotic, betadine impregnated drape, and closed hooded exhaust systems for each member of the surgical team.  A medial parapatellar incision was made in the extensor mechanism and the knee cap flipped and the knee flexed.  Some residual meniscal tissues were removed along with any remaining ACL/PCL tissue.  A guide was placed on the tibia and a flat cut was made on it's superior surface.  An intramedullary guide was placed in the femur and was utilized to make anterior and posterior cuts creating an appropriate flexion gap.  A second intramedullary guide was placed in the femur to make a distal cut properly balancing the knee with an extension gap equal to the flexion gap.  The three bones sized to the above mentioned sizes and the appropriate guides were placed and utilized.  A trial reduction was done and the knee easily came to full extension and the patella tracked well on flexion.  The trial components were removed and all bones were cleaned with pulsatile lavage and then dried thoroughly.  Cement was mixed and was pressurized onto the bones followed by placement of the aforementioned components.  Excess cement was trimmed and pressure was held on the components until the cement had hardened.  The tourniquet was deflated and a small amount of bleeding was controlled with cautery and pressure.  The knee was irrigated thoroughly.  The extensor mechanism was re-approximated with V-loc suture in running fashion.  The knee was flexed and the repair was solid.  The subcutaneous tissues were re-approximated with #0 and #2-0 vicryl and the skin closed with a  subcuticular stitch and steristrips.  A sterile dressing was applied.  Intraoperative fluids, EBL, and tourniquet time can be obtained from anesthesia records.  DISPOSITION:  The patient was taken to recovery room in stable  condition and admitted for appropriate post-op care to include peri-operative antibiotic and DVT prophylaxis with mechanical and pharmacologic measures.  Olusegun Gerstenberger G 03/18/2015, 9:10 AM

## 2015-03-18 NOTE — Evaluation (Signed)
Occupational Therapy Evaluation Patient Details Name: Timothy Hammond MRN: [id number] DOB: May 09, 1950 Today's Date: 03/18/2015    History of Present Illness Patient is a 64 y/o male s/p Rt TKA. PMH includes HTN, HLD, DM, depression, sleep apnea.   Clinical Impression   Pt reports he was independent with ADLs and mobility PTA. Currently pt is overall min guard for functional mobility and ADLs with the exception of min A for LB ADLs. All education complete; pt with no further questions or concerns for OT at this time. Pt planning to d/c home with 24/7 supervision from his son. Pt ready to d/c from an OT standpoint; signing off at this time. Thank you for this referral.     Follow Up Recommendations  No OT follow up;Supervision/Assistance - 24 hour    Equipment Recommendations  None recommended by OT    Recommendations for Other Services       Precautions / Restrictions Precautions Precautions: Knee Precaution Booklet Issued: No Precaution Comments: Reviewed no pillow under knee and precautions Restrictions Weight Bearing Restrictions: Yes RLE Weight Bearing: Weight bearing as tolerated      Mobility Bed Mobility Overal bed mobility: Needs Assistance Bed Mobility: Supine to Sit;Sit to Supine     Supine to sit: Supervision Sit to supine: Min assist   General bed mobility comments: Min A to guide LLE back into bed.   Transfers Overall transfer level: Needs assistance Equipment used: Rolling walker (2 wheeled) Transfers: Sit to/from Stand Sit to Stand: Min guard         General transfer comment: Min guard for safety. VC for hand placement. Sit <> stand from EOB x1    Balance Overall balance assessment: Needs assistance Sitting-balance support: Feet supported Sitting balance-Leahy Scale: Good     Standing balance support: Bilateral upper extremity supported Standing balance-Leahy Scale: Poor Standing balance comment: RW for support                             ADL Overall ADL's : Needs assistance/impaired Eating/Feeding: Set up;Sitting   Grooming: Min guard;Standing       Lower Body Bathing: Minimal assistance;Sit to/from stand       Lower Body Dressing: Minimal assistance;Sit to/from stand Lower Body Dressing Details (indicate cue type and reason): Educated on compensatory strategies for LB ADLs; pt verbalized understanding. Discussed use of AE for increased independence with LB ADLs; pt reports son is available to help as needed. Toilet Transfer: Min guard;Ambulation;BSC;RW (BSC over toilet) Toilet Transfer Details (indicate cue type and reason): Educated on use of 3 in 1 over toilet and in shower; pt verbalized understanding. Toileting- Architect and Hygiene: Min guard;Sit to/from stand   Tub/ Shower Transfer: Min guard;Ambulation;3 in 1;Rolling walker Tub/Shower Transfer Details (indicate cue type and reason): Eduated and demonstrated walk in shower transfer technique; pt able to return demonstrate technique.  Functional mobility during ADLs: Min guard;Rolling walker General ADL Comments: No family present for OT eval. Educated on edema management techniques, need for supervision for safety during ADLs and mobility; pt verbalized understanding.     Vision     Perception     Praxis      Pertinent Vitals/Pain Pain Assessment: Faces Faces Pain Scale: Hurts a little bit Pain Location: R knee Pain Descriptors / Indicators: Sore Pain Intervention(s): Limited activity within patient's tolerance;Monitored during session;Repositioned;Ice applied     Hand Dominance     Extremity/Trunk Assessment Upper Extremity Assessment Upper  Extremity Assessment: Overall WFL for tasks assessed   Lower Extremity Assessment Lower Extremity Assessment: Defer to PT evaluation RLE Deficits / Details: Limited AROM/strength secondary to pain/surgery RLE Sensation: decreased light touch   Cervical / Trunk  Assessment Cervical / Trunk Assessment: Normal   Communication Communication Communication: No difficulties   Cognition Arousal/Alertness: Awake/alert Behavior During Therapy: WFL for tasks assessed/performed Overall Cognitive Status: Within Functional Limits for tasks assessed                     General Comments       Exercises       Shoulder Instructions      Home Living Family/patient expects to be discharged to:: Private residence Living Arrangements: Children Available Help at Discharge: Family;Available 24 hours/day Type of Home: House Home Access: Stairs to enter Entergy CorporationEntrance Stairs-Number of Steps: 6 Entrance Stairs-Rails: Right Home Layout: One level     Bathroom Shower/Tub: Producer, television/film/videoWalk-in shower   Bathroom Toilet: Handicapped height Bathroom Accessibility: Yes How Accessible: Accessible via walker Home Equipment: Walker - 2 wheels;Bedside commode          Prior Functioning/Environment Level of Independence: Independent        Comments: Delivers parts for jets, drives a lot    OT Diagnosis: Acute pain   OT Problem List:     OT Treatment/Interventions:      OT Goals(Current goals can be found in the care plan section) Acute Rehab OT Goals Patient Stated Goal: to return to independence OT Goal Formulation: With patient  OT Frequency:     Barriers to D/C:            Co-evaluation              End of Session Equipment Utilized During Treatment: Gait belt;Rolling walker CPM Right Knee CPM Right Knee: Off  Activity Tolerance: Patient tolerated treatment well Patient left: in bed;with call bell/phone within reach   Time: 1536-1550 OT Time Calculation (min): 14 min Charges:  OT General Charges $OT Visit: 1 Procedure OT Evaluation $Initial OT Evaluation Tier I: 1 Procedure G-Codes:     Gaye AlkenBailey A Curby Carswell M.S., OTR/L Pager: [phone]  03/18/2015, 3:57 PM

## 2015-03-18 NOTE — Progress Notes (Signed)
Utilization review completed.  

## 2015-03-18 NOTE — Progress Notes (Signed)
Orthopedic Tech Progress Note Patient Details:  Timothy Hammond [birth year] [id number]  CPM Right Knee CPM Right Knee: On Right Knee Flexion (Degrees): 60 Right Knee Extension (Degrees): 0 Additional Comments: Trapeze bar and foot roll   [name] 03/18/2015, 10:44 AM

## 2015-03-18 NOTE — Anesthesia Postprocedure Evaluation (Signed)
  Anesthesia Post-op Note  Patient: Timothy Hammond  Procedure(s) Performed: Procedure(s): TOTAL KNEE ARTHROPLASTY (Right)  Patient Location: PACU  Anesthesia Type:General and Spinal  Level of Consciousness: awake and alert   Airway and Oxygen Therapy: Patient Spontanous Breathing  Post-op Pain: mild  Post-op Assessment: Post-op Vital signs reviewed              Post-op Vital Signs: Reviewed  Last Vitals:  Filed Vitals:   03/18/15 1021  BP: 120/70  Pulse: 74  Temp: 36.8 C  Resp: 15    Complications: No apparent anesthesia complications

## 2015-03-18 NOTE — Interval H&P Note (Signed)
OK for surgery PD 

## 2015-03-19 ENCOUNTER — Encounter (HOSPITAL_COMMUNITY): Payer: Self-pay | Admitting: Orthopaedic Surgery

## 2015-03-19 LAB — GLUCOSE, CAPILLARY
Glucose-Capillary: 132 mg/dL — ABNORMAL HIGH (ref 65–99)
Glucose-Capillary: 152 mg/dL — ABNORMAL HIGH (ref 65–99)
Glucose-Capillary: 189 mg/dL — ABNORMAL HIGH (ref 65–99)
Glucose-Capillary: 127 mg/dL — ABNORMAL HIGH (ref 65–99)

## 2015-03-19 MED ORDER — OXYCODONE HCL 5 MG PO TABS
5.0000 mg | ORAL_TABLET | ORAL | Status: DC | PRN
  Administered 2015-03-19 – 2015-03-20 (×5): 10 mg via ORAL
  Filled 2015-03-19 (×5): qty 2

## 2015-03-19 NOTE — Progress Notes (Addendum)
Physical Therapy Treatment Patient Details Name: Oretha Milchercy D Leap MRN: [id number] DOB: [birth year] Today's Date: 03/19/2015    History of Present Illness Patient is a 64 y/o male s/p Rt TKA. PMH includes HTN, HLD, DM, depression, sleep apnea.    PT Comments    Patient had limited mobility due to c/o nausea and pain and is progressing gradually toward goals. Continues to need skilled PT services to increase independence and supervision for OOB activities.  Follow Up Recommendations  Home health PT     Equipment Recommendations  None recommended by PT    Recommendations for Other Services       Precautions / Restrictions Precautions Precautions: Knee Precaution Comments: Reviewed no pillow under knee and precautions Restrictions Weight Bearing Restrictions: Yes RLE Weight Bearing: Weight bearing as tolerated    Mobility  Bed Mobility Overal bed mobility: Needs Assistance Bed Mobility: Supine to Sit     Supine to sit: Modified independent (Device/Increase time);Supervision (use of bedrails) Sit to supine: Min assist   General bed mobility comments: assistance elevating Rt LE into bed  Transfers Overall transfer level: Needs assistance Equipment used: Rolling walker (2 wheeled) Transfers: Sit to/from Stand Sit to Stand: Min guard         General transfer comment: Min guard for safety; vc for hand placement   Ambulation/Gait Ambulation/Gait assistance: Min guard (for safety) Ambulation Distance (Feet): 40 Feet Assistive device: Rolling walker (2 wheeled) Gait Pattern/deviations: Step-to pattern;Decreased stride length;Antalgic;Decreased stance time - right Gait velocity: decreased   General Gait Details: slow cadence; verbal cues for step through gait with facilitation of bilateral heel strike   Stairs Stairs: Yes Stairs assistance: Min guard Stair Management: With walker Number of Stairs: 1 General stair comments: verbal cues for sequencing    Wheelchair Mobility    Modified Rankin (Stroke Patients Only)       Balance Overall balance assessment: Needs assistance Sitting-balance support: No upper extremity supported;Feet supported Sitting balance-Leahy Scale: Good     Standing balance support: Bilateral upper extremity supported Standing balance-Leahy Scale: Poor Standing balance comment: use of RW                    Cognition Arousal/Alertness: Awake/alert Behavior During Therapy: WFL for tasks assessed/performed Overall Cognitive Status: Within Functional Limits for tasks assessed                      Exercises Total Joint Exercises Goniometric ROM: 45    General Comments General comments (skin integrity, edema, etc.): refused ambulating further due to c/o nausea      Pertinent Vitals/Pain Pain Assessment: 0-10 Pain Score: 8  Pain Location: Rt knee Pain Descriptors / Indicators: Sore Pain Intervention(s): Limited activity within patient's tolerance;Monitored during session;Premedicated before session;Repositioned    Home Living                      Prior Function            PT Goals (current goals can now be found in the care plan section) Acute Rehab PT Goals Patient Stated Goal: to return to independence PT Goal Formulation: With patient Time For Goal Achievement: 04/01/15 Potential to Achieve Goals: Good    Frequency  7X/week    PT Plan      Co-evaluation             End of Session Equipment Utilized During Treatment: Gait belt;Right knee immobilizer Activity Tolerance: Patient tolerated treatment  well Patient left: with call bell/phone within reach;in bed;with SCD's reapplied     Time: 2130-8657 PT Time Calculation (min) (ACUTE ONLY): 19 min  Charges:  $Gait Training: 8-22 mins                    G CodesCarolynne Edouard, PTA [phone] 03/19/2015, 4:43 PM

## 2015-03-19 NOTE — Progress Notes (Signed)
Subjective: 1 Day Post-Op Procedure(s) (LRB): TOTAL KNEE ARTHROPLASTY (Right)  Activity level:  wbat Diet tolerance:  ok Voiding:  ok Patient reports pain as mild and moderate.    Objective: Vital signs in last 24 hours: Temp:  [98.3 F (36.8 C)-99.8 F (37.7 C)] 99 F (37.2 C) (11/16 0511) Pulse Rate:  [70-87] 70 (11/16 0511) Resp:  [14-20] 20 (11/16 0511) BP: (105-148)/(47-85) 117/69 mmHg (11/16 0511) SpO2:  [92 %-99 %] 95 % (11/16 0511)  Labs: No results for input(s): HGB in the last 72 hours. No results for input(s): WBC, RBC, HCT, PLT in the last 72 hours. No results for input(s): NA, K, CL, CO2, BUN, CREATININE, GLUCOSE, CALCIUM in the last 72 hours. No results for input(s): LABPT, INR in the last 72 hours.  Physical Exam:  Neurologically intact ABD soft Neurovascular intact Sensation intact distally Intact pulses distally Dorsiflexion/Plantar flexion intact Incision: dressing C/D/I and no drainage No cellulitis present Compartment soft  Assessment/Plan:  1 Day Post-Op Procedure(s) (LRB): TOTAL KNEE ARTHROPLASTY (Right) Advance diet Up with therapy D/C IV fluids Plan for discharge tomorrow Discharge home with home health  Continue on ASA 325mg  BID x 2 weeks post op. Follow up in office 2 weeks post op. I will change dressing to aquacel tomorrow.    Timothy Hammond, Ginger OrganNDREW PAUL 03/19/2015, 8:12 AM

## 2015-03-19 NOTE — Care Management Note (Signed)
Case Management Note  Patient Details  Name: Timothy Hammond MRN: [id number] Date of Birth: 1951-04-12  Subjective/Objective:      S/p right total knee arthroplasty              Action/Plan: Set up with [facility] for HHPT by MD office. Spoke with patient, no change in discharge plan. Patient stated that [employer] delivered CPM, rolling walker and 3N1 to home. Patient stated that his son will be able to assist him after discharge.    Expected Discharge Date:                  Expected Discharge Plan:  Home w Home Health Services  In-House Referral:  NA  Discharge planning Services  CM Consult  Post Acute Care Choice:  Durable Medical Equipment, Home Health Choice offered to:  Patient  DME Arranged:  3-N-1, CPM, Walker rolling DME Agency:  [employer]  HH Arranged:  PT, OT, Nurse's Aide HH Agency:  [facility]  Status of Service:  Completed, signed off  Medicare Important Message Given:    Date Medicare IM Given:    Medicare IM give by:    Date Additional Medicare IM Given:    Additional Medicare Important Message give by:     If discussed at Long Length of Stay Meetings, dates discussed:    Additional Comments:  Timothy Hammond, Timothy Empson Watson, RN 03/19/2015, 11:01 AM

## 2015-03-19 NOTE — Progress Notes (Addendum)
Physical Therapy Treatment Patient Details Name: Oretha Milchercy D Dais MRN: [id number] DOB: 01-26-1951 Today's Date: 03/19/2015    History of Present Illness Patient is a 64 y/o male s/p Rt TKA. PMH includes HTN, HLD, DM, depression, sleep apnea.    PT Comments    Progressing toward PT goals requiring supervision/min guard for all functional mobility and ability to ambulate 60 ft with RW. Continue to progress patient as tolerated with anticipation of returning home with home health PT.   Follow Up Recommendations  Home health PT     Equipment Recommendations  None recommended by PT    Recommendations for Other Services       Precautions / Restrictions Precautions Precautions: Knee Precaution Comments: Reviewed no pillow under knee and precautions Restrictions Weight Bearing Restrictions: Yes RLE Weight Bearing: Weight bearing as tolerated    Mobility  Bed Mobility Overal bed mobility: Needs Assistance Bed Mobility: Supine to Sit     Supine to sit: Modified independent (Device/Increase time);Supervision (use of bedrails)     General bed mobility comments: no physical assistance needed; verbal cues for technique; supervision for safety  Transfers Overall transfer level: Needs assistance Equipment used: Rolling walker (2 wheeled) Transfers: Sit to/from Stand Sit to Stand: Min guard         General transfer comment: Min guard for safety; vc for hand placement   Ambulation/Gait Ambulation/Gait assistance: Min guard Ambulation Distance (Feet): 60 Feet Assistive device: Rolling walker (2 wheeled) Gait Pattern/deviations: Step-to pattern;Decreased stance time - right;Antalgic Gait velocity: decreased   General Gait Details: slow cadence; verbal cues for step through gait with facilitation of bilateral heel strike   Stairs            Wheelchair Mobility    Modified Rankin (Stroke Patients Only)       Balance Overall balance assessment: Needs  assistance Sitting-balance support: No upper extremity supported;Feet supported Sitting balance-Leahy Scale: Good     Standing balance support: Bilateral upper extremity supported Standing balance-Leahy Scale: Poor Standing balance comment: use of RW                    Cognition Arousal/Alertness: Awake/alert Behavior During Therapy: WFL for tasks assessed/performed Overall Cognitive Status: Within Functional Limits for tasks assessed                      Exercises Total Joint Exercises Ankle Circles/Pumps: AROM;Strengthening;Both;20 reps Quad Sets: AROM;Strengthening;Right;10 reps Short Arc Quad: AROM;Right;Strengthening;10 reps Heel Slides: AAROM;Strengthening;Right;10 reps Hip ABduction/ADduction: AROM;Strengthening;Right;10 reps Straight Leg Raises: Strengthening;Right;10 reps;AAROM    General Comments        Pertinent Vitals/Pain Pain Assessment: 0-10 Pain Score: 6  Pain Location: Rt knee Pain Descriptors / Indicators: Sore Pain Intervention(s): Limited activity within patient's tolerance;Monitored during session;Premedicated before session    Home Living                      Prior Function            PT Goals (current goals can now be found in the care plan section) Acute Rehab PT Goals Patient Stated Goal: to return to independence PT Goal Formulation: With patient Time For Goal Achievement: 04/01/15 Potential to Achieve Goals: Good Progress towards PT goals: Progressing toward goals    Frequency  7X/week    PT Plan      Co-evaluation             End of Session Equipment Utilized During  Treatment: Gait belt;Right knee immobilizer Activity Tolerance: Patient tolerated treatment well Patient left: in chair;with call bell/phone within reach     Time: 1018-1051 PT Time Calculation (min) (ACUTE ONLY): 33 min  Charges:  $Gait Training: 8-22 mins $Therapeutic Exercise: 8-22 mins                    G CodesErline Levine, PTA [phone] 03/19/2015 11:45 AM  ,

## 2015-03-20 LAB — GLUCOSE, CAPILLARY
GLUCOSE-CAPILLARY: 180 mg/dL — AB (ref 65–99)
GLUCOSE-CAPILLARY: 133 mg/dL — AB (ref 65–99)

## 2015-03-20 MED ORDER — ASPIRIN 325 MG PO TBEC: 325.0000 mg | DELAYED_RELEASE_TABLET | Freq: Two times a day (BID) | ORAL | Status: DC

## 2015-03-20 MED ORDER — METHOCARBAMOL 500 MG PO TABS: 500.0000 mg | ORAL_TABLET | Freq: Four times a day (QID) | ORAL | Status: DC | PRN

## 2015-03-20 MED ORDER — OXYCODONE-ACETAMINOPHEN 5-325 MG PO TABS: 1.0000 | ORAL_TABLET | ORAL | Status: DC | PRN

## 2015-03-20 NOTE — Progress Notes (Signed)
Physical Therapy Treatment Patient Details Name: Timothy Hammond MRN: [id number] DOB: [birth year] Today's Date: 03/20/2015    History of Present Illness Patient is a 64 y/o male s/p Rt TKA. PMH includes HTN, HLD, DM, depression, sleep apnea.    PT Comments    Progressing gradually toward mobility goals with ambulation of 127ft and increased Rt knee ROM up to 60 degrees AAROM. Continue progressing patient as tolerated. Patient will benefit from continued skilled PT services and needs supervision for safety for OOB activities.   Follow Up Recommendations  Home health PT     Equipment Recommendations  None recommended by PT    Recommendations for Other Services       Precautions / Restrictions Precautions Precautions: Knee Precaution Comments: Reviewed no pillow under knee and precautions Restrictions Weight Bearing Restrictions: Yes RLE Weight Bearing: Weight bearing as tolerated    Mobility  Bed Mobility Overal bed mobility: Needs Assistance Bed Mobility: Supine to Sit     Supine to sit: Supervision (use of Lt bed rail) Sit to supine: Min guard   General bed mobility comments: verbal cues for hand placement and slowing down to make transferring safer  Transfers Overall transfer level: Needs assistance Equipment used: Rolling walker (2 wheeled) Transfers: Sit to/from Stand Sit to Stand: Supervision;Min guard         General transfer comment: Min guard for safety when descending; vc for hand placement   Ambulation/Gait Ambulation/Gait assistance: Min guard Ambulation Distance (Feet): 100 Feet Assistive device: Rolling walker (2 wheeled) Gait Pattern/deviations: Step-to pattern;Antalgic Gait velocity: decreased   General Gait Details: slow cadence; verbal cues for step through gait with facilitation of bilateral heel strike and increased Rt knee flexion    Stairs            Wheelchair Mobility    Modified Rankin (Stroke Patients Only)        Balance Overall balance assessment: Needs assistance Sitting-balance support: No upper extremity supported Sitting balance-Leahy Scale: Good     Standing balance support: Bilateral upper extremity supported Standing balance-Leahy Scale: Poor Standing balance comment: use of RW                    Cognition Arousal/Alertness: Awake/alert Behavior During Therapy: WFL for tasks assessed/performed Overall Cognitive Status: Within Functional Limits for tasks assessed                      Exercises Total Joint Exercises Ankle Circles/Pumps: AROM;Strengthening;Both;20 reps Quad Sets: AROM;Strengthening;Right;10 reps Short Arc Quad: AROM;Strengthening;Right;10 reps Heel Slides: AAROM;Strengthening;Right;10 reps Hip ABduction/ADduction: AROM;Strengthening;Right;10 reps Straight Leg Raises: AROM;Strengthening;Right;10 reps Goniometric ROM:  (50 degrees AROM; 60 degrees AAROM)    General Comments General comments (skin integrity, edema, etc.): instruction on increased safety awareness when transfering and importance of completing HEP as instructed       Pertinent Vitals/Pain Pain Assessment: 0-10 Pain Score: 5  Pain Location: Rt knee Pain Descriptors / Indicators: Sore;Tender Pain Intervention(s): Limited activity within patient's tolerance;Premedicated before session    Home Living                      Prior Function            PT Goals (current goals can now be found in the care plan section) Acute Rehab PT Goals Patient Stated Goal: to return to independence PT Goal Formulation: With patient Time For Goal Achievement: 04/01/15 Potential to Achieve Goals: Good Progress towards PT goals:  Progressing toward goals    Frequency  7X/week    PT Plan      Co-evaluation             End of Session Equipment Utilized During Treatment: Gait belt;Right knee immobilizer Activity Tolerance: Patient tolerated treatment well Patient left: with call  bell/phone within reach;in bed;with SCD's reapplied     Time: [id number] PT Time Calculation (min) (ACUTE ONLY): 38 min  Charges:  $Gait Training: 8-22 mins $Therapeutic Exercise: 8-22 mins $Therapeutic Activity: 8-22 mins                    G CodesCarolynne Edouard:      Ademide Schaberg R Virgil Slinger, PTA [phone]  03/20/2015, 10:24 AM

## 2015-03-20 NOTE — Progress Notes (Signed)
Pt ready for discharge. IV removed and belongings gathered. Received hard copy of prescriptions and went over discharge education/instructions--all questions/concerns answered. Pt has follow up appointment with Dr. Jerl Santosalldorf set up as well as home therapy visits with [facility]. Pt and his belongings will be transported out via wheelchair. Will continue to monitor.

## 2015-03-20 NOTE — Discharge Summary (Signed)
Patient ID: Timothy Hammond MRN: [id number] DOB/AGE: Mar 26, 1951 64 y.o.  Admit date: 03/18/2015 Discharge date: 03/20/2015  Admission Diagnoses:  Principal Problem:   Primary osteoarthritis of right knee Active Problems:   Diabetes [facility](HCC)   Discharge Diagnoses:  Same  Past Medical History  Diagnosis Date  . Hypertension   . Hyperlipidemia   . Diabetes mellitus   . Arthritis   . Anxiety   . Depression   . Sleep apnea     Surgeries: Procedure(s): TOTAL KNEE ARTHROPLASTY on 03/18/2015   Consultants:    Discharged Condition: Improved  Hospital Course: Timothy Hammond is an 64 y.o. male who was admitted 03/18/2015 for operative treatment ofPrimary osteoarthritis of right knee. Patient has severe unremitting pain that affects sleep, daily activities, and work/hobbies. After pre-op clearance the patient was taken to the operating room on 03/18/2015 and underwent  Procedure(s): TOTAL KNEE ARTHROPLASTY.    Patient was given perioperative antibiotics: Anti-infectives    Start     Dose/Rate Route Frequency Ordered Stop   03/18/15 1330  ceFAZolin (ANCEF) IVPB 2 g/50 mL premix     2 g 100 mL/hr over 30 Minutes Intravenous Every 6 hours 03/18/15 1038 03/19/15 0131   03/18/15 0600  ceFAZolin (ANCEF) IVPB 2 g/50 mL premix     2 g 100 mL/hr over 30 Minutes Intravenous On call to O.R. 03/17/15 1339 03/18/15 0732       Patient was given sequential compression devices, early ambulation, and chemoprophylaxis to prevent DVT.  Patient benefited maximally from hospital stay and there were no complications.    Recent vital signs: Patient Vitals for the past 24 hrs:  BP Temp Temp src Pulse Resp SpO2  03/20/15 0517 119/71 mmHg 99.1 F (37.3 C) Oral 82 18 95 %  03/19/15 2210 122/76 mmHg 98.2 F (36.8 C) Oral 71 18 96 %  03/19/15 1226 119/71 mmHg 98.8 F (37.1 C) - 66 18 96 %     Recent laboratory studies: No results for input(s): WBC, HGB, HCT, PLT, NA, K, CL, CO2, BUN,  CREATININE, GLUCOSE, INR, CALCIUM in the last 72 hours.  Invalid input(s): PT, 2   Discharge Medications:     Medication List    TAKE these medications        ALPRAZolam 0.5 MG tablet  Commonly known as:  XANAX  Take 0.5 mg by mouth at bedtime as needed for anxiety or sleep.     aspirin 325 MG EC tablet  Take 1 tablet (325 mg total) by mouth 2 (two) times daily after a meal.     metFORMIN 500 MG tablet  Commonly known as:  GLUCOPHAGE  Take 500 mg by mouth 2 (two) times daily with a meal.     methocarbamol 500 MG tablet  Commonly known as:  ROBAXIN  Take 1 tablet (500 mg total) by mouth every 6 (six) hours as needed for muscle spasms.     NIFEdipine 90 MG 24 hr tablet  Commonly known as:  PROCARDIA XL/ADALAT-CC  Take 90 mg by mouth daily.     oxyCODONE-acetaminophen 5-325 MG tablet  Commonly known as:  ROXICET  Take 1-2 tablets by mouth every 4 (four) hours as needed for severe pain.     PARoxetine 40 MG tablet  Commonly known as:  PAXIL  Take 60 mg by mouth every morning.     pravastatin 40 MG tablet  Commonly known as:  PRAVACHOL  Take 40 mg by mouth daily.     TOPROL  XL 50 MG 24 hr tablet  Generic drug:  metoprolol succinate  Take 50 mg by mouth daily. Take with or immediately following a meal.     zolpidem 5 MG tablet  Commonly known as:  AMBIEN  Take 5 mg by mouth at bedtime as needed for sleep.        Diagnostic Studies: Dg Chest 2 View  03/10/2015  CLINICAL DATA:  Arthroplasty. EXAM: CHEST  2 VIEW COMPARISON:  No prior available for comparison . FINDINGS: Mediastinum and hilar structures normal. Mild left lower lobe infiltrate subsegmental atelectasis and/or scarring. No pleural effusion or pneumothorax. No acute bony abnormality . Degenerative changes thoracic spine IMPRESSION: Left base subsegmental atelectasis and/or scarring. Electronically Signed   By: Maisie Fus  Register   On: 03/10/2015 09:32    Disposition: 01-Home or Self Care      Discharge  Instructions    Call MD / Call 911    Complete by:  As directed   If you experience chest pain or shortness of breath, CALL 911 and be transported to the hospital emergency room.  If you develope a fever above 101 F, pus (white drainage) or increased drainage or redness at the wound, or calf pain, call your surgeon's office.     Constipation Prevention    Complete by:  As directed   Drink plenty of fluids.  Prune juice may be helpful.  You may use a stool softener, such as Colace (over the counter) 100 mg twice a day.  Use MiraLax (over the counter) for constipation as needed.     Diet - low sodium heart healthy    Complete by:  As directed      Discharge instructions    Complete by:  As directed   INSTRUCTIONS AFTER JOINT REPLACEMENT   Remove items at home which could result in a fall. This includes throw rugs or furniture in walking pathways ICE to the affected joint every three hours while awake for 30 minutes at a time, for at least the first 3-5 days, and then as needed for pain and swelling.  Continue to use ice for pain and swelling. You may notice swelling that will progress down to the foot and ankle.  This is normal after surgery.  Elevate your leg when you are not up walking on it.   Continue to use the breathing machine you got in the hospital (incentive spirometer) which will help keep your temperature down.  It is common for your temperature to cycle up and down following surgery, especially at night when you are not up moving around and exerting yourself.  The breathing machine keeps your lungs expanded and your temperature down.   DIET:  As you were doing prior to hospitalization, we recommend a well-balanced diet.  DRESSING / WOUND CARE / SHOWERING  You may shower 3 days after surgery, but keep the wounds dry during showering.  You may use an occlusive plastic wrap (Press'n Seal for example), NO SOAKING/SUBMERGING IN THE BATHTUB.  If the bandage gets wet, change with a clean dry  gauze.  If the incision gets wet, pat the wound dry with a clean towel.  ACTIVITY  Increase activity slowly as tolerated, but follow the weight bearing instructions below.   No driving for 6 weeks or until further direction given by your physician.  You cannot drive while taking narcotics.  No lifting or carrying greater than 10 lbs. until further directed by your surgeon. Avoid periods of inactivity such as  sitting longer than an hour when not asleep. This helps prevent blood clots.  You may return to work once you are authorized by your doctor.     WEIGHT BEARING   Weight bearing as tolerated with assist device (walker, cane, etc) as directed, use it as long as suggested by your surgeon or therapist, typically at least 4-6 weeks.   EXERCISES  Results after joint replacement surgery are often greatly improved when you follow the exercise, range of motion and muscle strengthening exercises prescribed by your doctor. Safety measures are also important to protect the joint from further injury. Any time any of these exercises cause you to have increased pain or swelling, decrease what you are doing until you are comfortable again and then slowly increase them. If you have problems or questions, call your caregiver or physical therapist for advice.   Rehabilitation is important following a joint replacement. After just a few days of immobilization, the muscles of the leg can become weakened and shrink (atrophy).  These exercises are designed to build up the tone and strength of the thigh and leg muscles and to improve motion. Often times heat used for twenty to thirty minutes before working out will loosen up your tissues and help with improving the range of motion but do not use heat for the first two weeks following surgery (sometimes heat can increase post-operative swelling).   These exercises can be done on a training (exercise) mat, on the floor, on a table or on a bed. Use whatever works  the best and is most comfortable for you.    Use music or television while you are exercising so that the exercises are a pleasant break in your day. This will make your life better with the exercises acting as a break in your routine that you can look forward to.   Perform all exercises about fifteen times, three times per day or as directed.  You should exercise both the operative leg and the other leg as well.   Exercises include:   Quad Sets - Tighten up the muscle on the front of the thigh (Quad) and hold for 5-10 seconds.   Straight Leg Raises - With your knee straight (if you were given a brace, keep it on), lift the leg to 60 degrees, hold for 3 seconds, and slowly lower the leg.  Perform this exercise against resistance later as your leg gets stronger.  Leg Slides: Lying on your back, slowly slide your foot toward your buttocks, bending your knee up off the floor (only go as far as is comfortable). Then slowly slide your foot back down until your leg is flat on the floor again.  Angel Wings: Lying on your back spread your legs to the side as far apart as you can without causing discomfort.  Hamstring Strength:  Lying on your back, push your heel against the floor with your leg straight by tightening up the muscles of your buttocks.  Repeat, but this time bend your knee to a comfortable angle, and push your heel against the floor.  You may put a pillow under the heel to make it more comfortable if necessary.   A rehabilitation program following joint replacement surgery can speed recovery and prevent re-injury in the future due to weakened muscles. Contact your doctor or a physical therapist for more information on knee rehabilitation.    CONSTIPATION  Constipation is defined medically as fewer than three stools per week and severe constipation as less than  one stool per week.  Even if you have a regular bowel pattern at home, your normal regimen is likely to be disrupted due to multiple  reasons following surgery.  Combination of anesthesia, postoperative narcotics, change in appetite and fluid intake all can affect your bowels.   YOU MUST use at least one of the following options; they are listed in order of increasing strength to get the job done.  They are all available over the counter, and you may need to use some, POSSIBLY even all of these options:    Drink plenty of fluids (prune juice may be helpful) and high fiber foods Colace 100 mg by mouth twice a day  Senokot for constipation as directed and as needed Dulcolax (bisacodyl), take with full glass of water  Miralax (polyethylene glycol) once or twice a day as needed.  If you have tried all these things and are unable to have a bowel movement in the first 3-4 days after surgery call either your surgeon or your primary doctor.    If you experience loose stools or diarrhea, hold the medications until you stool forms back up.  If your symptoms do not get better within 1 week or if they get worse, check with your doctor.  If you experience "the worst abdominal pain ever" or develop nausea or vomiting, please contact the office immediately for further recommendations for treatment.   ITCHING:  If you experience itching with your medications, try taking only a single pain pill, or even half a pain pill at a time.  You can also use Benadryl over the counter for itching or also to help with sleep.   TED HOSE STOCKINGS:  Use stockings on both legs until for at least 2 weeks or as directed by physician office. They may be removed at night for sleeping.  MEDICATIONS:  See your medication summary on the "After Visit Summary" that nursing will review with you.  You may have some home medications which will be placed on hold until you complete the course of blood thinner medication.  It is important for you to complete the blood thinner medication as prescribed.  PRECAUTIONS:  If you experience chest pain or shortness of breath - call  911 immediately for transfer to the hospital emergency department.   If you develop a fever greater that 101 F, purulent drainage from wound, increased redness or drainage from wound, foul odor from the wound/dressing, or calf pain - CONTACT YOUR SURGEON.                                                   FOLLOW-UP APPOINTMENTS:  If you do not already have a post-op appointment, please call the office for an appointment to be seen by your surgeon.  Guidelines for how soon to be seen are listed in your "After Visit Summary", but are typically between 1-4 weeks after surgery.  OTHER INSTRUCTIONS:   Knee Replacement:  Do not place pillow under knee, focus on keeping the knee straight while resting. CPM instructions: 0-90 degrees, 2 hours in the morning, 2 hours in the afternoon, and 2 hours in the evening. Place foam block, curve side up under heel at all times except when in CPM or when walking.  DO NOT modify, tear, cut, or change the foam block in any way.  MAKE SURE  YOU:  Understand these instructions.  Get help right away if you are not doing well or get worse.    Thank you for letting us be a part of your medical care team.  It is a privilege we respect greatly.  We hope these instructions will help you stay on track for a fast and full recovery!     Increase activity slowly as tolerated    Complete by:  As directed            Follow-up Information    Follow up with Velna Ochs, MD. Schedule an appointment as soon as possible for a visit in 2 weeks.   Specialty:  Orthopedic Surgery   Contact information:   [address] [place] [address] [phone]       Follow up with [facility].   Why:  They will contact you to schedule home therapy visits.   Contact information:   [address] [place] [address] [phone]        Signed: Drema Halon 03/20/2015, 12:21 PM

## 2015-06-20 DIAGNOSIS — M25561 Pain in right knee: Secondary | ICD-10-CM | POA: Diagnosis not present

## 2015-07-24 DIAGNOSIS — F410 Panic disorder [episodic paroxysmal anxiety] without agoraphobia: Secondary | ICD-10-CM | POA: Diagnosis not present

## 2015-08-25 DIAGNOSIS — F329 Major depressive disorder, single episode, unspecified: Secondary | ICD-10-CM | POA: Diagnosis not present

## 2015-09-17 DIAGNOSIS — M25561 Pain in right knee: Secondary | ICD-10-CM | POA: Diagnosis not present

## 2015-09-22 DIAGNOSIS — E119 Type 2 diabetes mellitus without complications: Secondary | ICD-10-CM | POA: Diagnosis not present

## 2015-10-20 DIAGNOSIS — F329 Major depressive disorder, single episode, unspecified: Secondary | ICD-10-CM | POA: Diagnosis not present

## 2016-02-20 DIAGNOSIS — E119 Type 2 diabetes mellitus without complications: Secondary | ICD-10-CM | POA: Diagnosis not present

## 2016-02-20 DIAGNOSIS — I10 Essential (primary) hypertension: Secondary | ICD-10-CM | POA: Diagnosis not present

## 2016-02-20 DIAGNOSIS — Z125 Encounter for screening for malignant neoplasm of prostate: Secondary | ICD-10-CM | POA: Diagnosis not present

## 2016-02-20 DIAGNOSIS — E784 Other hyperlipidemia: Secondary | ICD-10-CM | POA: Diagnosis not present

## 2016-02-26 DIAGNOSIS — E784 Other hyperlipidemia: Secondary | ICD-10-CM | POA: Diagnosis not present

## 2016-02-26 DIAGNOSIS — I10 Essential (primary) hypertension: Secondary | ICD-10-CM | POA: Diagnosis not present

## 2016-02-26 DIAGNOSIS — E119 Type 2 diabetes mellitus without complications: Secondary | ICD-10-CM | POA: Diagnosis not present

## 2016-02-26 DIAGNOSIS — M1990 Unspecified osteoarthritis, unspecified site: Secondary | ICD-10-CM | POA: Diagnosis not present

## 2016-02-26 DIAGNOSIS — K5909 Other constipation: Secondary | ICD-10-CM | POA: Diagnosis not present

## 2016-02-26 DIAGNOSIS — Z0000 Encounter for general adult medical examination without abnormal findings: Secondary | ICD-10-CM | POA: Diagnosis not present

## 2016-02-26 DIAGNOSIS — J3089 Other allergic rhinitis: Secondary | ICD-10-CM | POA: Diagnosis not present

## 2016-02-26 DIAGNOSIS — K219 Gastro-esophageal reflux disease without esophagitis: Secondary | ICD-10-CM | POA: Diagnosis not present

## 2016-02-26 DIAGNOSIS — Z23 Encounter for immunization: Secondary | ICD-10-CM | POA: Diagnosis not present

## 2016-02-26 DIAGNOSIS — G4739 Other sleep apnea: Secondary | ICD-10-CM | POA: Diagnosis not present

## 2016-03-04 DIAGNOSIS — Z1212 Encounter for screening for malignant neoplasm of rectum: Secondary | ICD-10-CM | POA: Diagnosis not present

## 2016-03-11 DIAGNOSIS — Z23 Encounter for immunization: Secondary | ICD-10-CM | POA: Diagnosis not present

## 2016-04-19 DIAGNOSIS — H5203 Hypermetropia, bilateral: Secondary | ICD-10-CM | POA: Diagnosis not present

## 2016-05-11 DIAGNOSIS — D225 Melanocytic nevi of trunk: Secondary | ICD-10-CM | POA: Diagnosis not present

## 2016-05-11 DIAGNOSIS — L080 Pyoderma: Secondary | ICD-10-CM | POA: Diagnosis not present

## 2016-05-11 DIAGNOSIS — D1801 Hemangioma of skin and subcutaneous tissue: Secondary | ICD-10-CM | POA: Diagnosis not present

## 2016-05-11 DIAGNOSIS — L308 Other specified dermatitis: Secondary | ICD-10-CM | POA: Diagnosis not present

## 2016-05-11 DIAGNOSIS — L570 Actinic keratosis: Secondary | ICD-10-CM | POA: Diagnosis not present

## 2016-05-11 DIAGNOSIS — L814 Other melanin hyperpigmentation: Secondary | ICD-10-CM | POA: Diagnosis not present

## 2016-05-11 DIAGNOSIS — L821 Other seborrheic keratosis: Secondary | ICD-10-CM | POA: Diagnosis not present

## 2016-05-27 DIAGNOSIS — L570 Actinic keratosis: Secondary | ICD-10-CM | POA: Diagnosis not present

## 2016-06-17 DIAGNOSIS — G4733 Obstructive sleep apnea (adult) (pediatric): Secondary | ICD-10-CM | POA: Diagnosis not present

## 2016-08-24 DIAGNOSIS — E784 Other hyperlipidemia: Secondary | ICD-10-CM | POA: Diagnosis not present

## 2016-08-24 DIAGNOSIS — F329 Major depressive disorder, single episode, unspecified: Secondary | ICD-10-CM | POA: Diagnosis not present

## 2016-08-24 DIAGNOSIS — E119 Type 2 diabetes mellitus without complications: Secondary | ICD-10-CM | POA: Diagnosis not present

## 2016-08-24 DIAGNOSIS — G4733 Obstructive sleep apnea (adult) (pediatric): Secondary | ICD-10-CM | POA: Diagnosis not present

## 2016-08-24 DIAGNOSIS — I10 Essential (primary) hypertension: Secondary | ICD-10-CM | POA: Diagnosis not present

## 2016-09-01 DIAGNOSIS — M25561 Pain in right knee: Secondary | ICD-10-CM | POA: Diagnosis not present

## 2016-10-04 DIAGNOSIS — G4733 Obstructive sleep apnea (adult) (pediatric): Secondary | ICD-10-CM | POA: Diagnosis not present

## 2016-10-31 DIAGNOSIS — E119 Type 2 diabetes mellitus without complications: Secondary | ICD-10-CM | POA: Diagnosis not present

## 2016-12-01 DIAGNOSIS — E119 Type 2 diabetes mellitus without complications: Secondary | ICD-10-CM | POA: Diagnosis not present

## 2017-01-01 DIAGNOSIS — E119 Type 2 diabetes mellitus without complications: Secondary | ICD-10-CM | POA: Diagnosis not present

## 2017-01-31 DIAGNOSIS — E119 Type 2 diabetes mellitus without complications: Secondary | ICD-10-CM | POA: Diagnosis not present

## 2017-02-14 DIAGNOSIS — F329 Major depressive disorder, single episode, unspecified: Secondary | ICD-10-CM | POA: Diagnosis not present

## 2017-02-24 DIAGNOSIS — E7849 Other hyperlipidemia: Secondary | ICD-10-CM | POA: Diagnosis not present

## 2017-02-24 DIAGNOSIS — E119 Type 2 diabetes mellitus without complications: Secondary | ICD-10-CM | POA: Diagnosis not present

## 2017-02-24 DIAGNOSIS — Z125 Encounter for screening for malignant neoplasm of prostate: Secondary | ICD-10-CM | POA: Diagnosis not present

## 2017-02-24 DIAGNOSIS — I10 Essential (primary) hypertension: Secondary | ICD-10-CM | POA: Diagnosis not present

## 2017-03-03 DIAGNOSIS — E119 Type 2 diabetes mellitus without complications: Secondary | ICD-10-CM | POA: Diagnosis not present

## 2017-03-03 DIAGNOSIS — F3289 Other specified depressive episodes: Secondary | ICD-10-CM | POA: Diagnosis not present

## 2017-03-03 DIAGNOSIS — Z0000 Encounter for general adult medical examination without abnormal findings: Secondary | ICD-10-CM | POA: Diagnosis not present

## 2017-03-03 DIAGNOSIS — Z23 Encounter for immunization: Secondary | ICD-10-CM | POA: Diagnosis not present

## 2017-03-03 DIAGNOSIS — R1319 Other dysphagia: Secondary | ICD-10-CM | POA: Diagnosis not present

## 2017-03-03 DIAGNOSIS — R351 Nocturia: Secondary | ICD-10-CM | POA: Diagnosis not present

## 2017-03-04 DIAGNOSIS — Z1212 Encounter for screening for malignant neoplasm of rectum: Secondary | ICD-10-CM | POA: Diagnosis not present

## 2017-03-14 DIAGNOSIS — F329 Major depressive disorder, single episode, unspecified: Secondary | ICD-10-CM | POA: Diagnosis not present

## 2017-04-02 DIAGNOSIS — E119 Type 2 diabetes mellitus without complications: Secondary | ICD-10-CM | POA: Diagnosis not present

## 2017-04-19 DIAGNOSIS — F329 Major depressive disorder, single episode, unspecified: Secondary | ICD-10-CM | POA: Diagnosis not present

## 2017-05-10 DIAGNOSIS — L814 Other melanin hyperpigmentation: Secondary | ICD-10-CM | POA: Diagnosis not present

## 2017-05-10 DIAGNOSIS — L570 Actinic keratosis: Secondary | ICD-10-CM | POA: Diagnosis not present

## 2017-05-10 DIAGNOSIS — D1801 Hemangioma of skin and subcutaneous tissue: Secondary | ICD-10-CM | POA: Diagnosis not present

## 2017-05-10 DIAGNOSIS — L821 Other seborrheic keratosis: Secondary | ICD-10-CM | POA: Diagnosis not present

## 2017-05-10 DIAGNOSIS — L0889 Other specified local infections of the skin and subcutaneous tissue: Secondary | ICD-10-CM | POA: Diagnosis not present

## 2017-06-02 DIAGNOSIS — F329 Major depressive disorder, single episode, unspecified: Secondary | ICD-10-CM | POA: Diagnosis not present

## 2017-06-03 DIAGNOSIS — E119 Type 2 diabetes mellitus without complications: Secondary | ICD-10-CM | POA: Diagnosis not present

## 2017-06-10 DIAGNOSIS — H5203 Hypermetropia, bilateral: Secondary | ICD-10-CM | POA: Diagnosis not present

## 2017-07-01 DIAGNOSIS — E119 Type 2 diabetes mellitus without complications: Secondary | ICD-10-CM | POA: Diagnosis not present

## 2017-07-01 DIAGNOSIS — F329 Major depressive disorder, single episode, unspecified: Secondary | ICD-10-CM | POA: Diagnosis not present

## 2017-08-01 DIAGNOSIS — E119 Type 2 diabetes mellitus without complications: Secondary | ICD-10-CM | POA: Diagnosis not present

## 2017-09-02 DIAGNOSIS — E119 Type 2 diabetes mellitus without complications: Secondary | ICD-10-CM | POA: Diagnosis not present

## 2017-09-02 DIAGNOSIS — Z96651 Presence of right artificial knee joint: Secondary | ICD-10-CM | POA: Diagnosis not present

## 2017-09-02 DIAGNOSIS — G4739 Other sleep apnea: Secondary | ICD-10-CM | POA: Diagnosis not present

## 2017-09-02 DIAGNOSIS — M25561 Pain in right knee: Secondary | ICD-10-CM | POA: Diagnosis not present

## 2017-09-02 DIAGNOSIS — N3281 Overactive bladder: Secondary | ICD-10-CM | POA: Diagnosis not present

## 2017-09-02 DIAGNOSIS — F3289 Other specified depressive episodes: Secondary | ICD-10-CM | POA: Diagnosis not present

## 2017-09-13 DIAGNOSIS — G4733 Obstructive sleep apnea (adult) (pediatric): Secondary | ICD-10-CM | POA: Diagnosis not present

## 2017-09-23 DIAGNOSIS — F329 Major depressive disorder, single episode, unspecified: Secondary | ICD-10-CM | POA: Diagnosis not present

## 2017-10-01 DIAGNOSIS — E119 Type 2 diabetes mellitus without complications: Secondary | ICD-10-CM | POA: Diagnosis not present

## 2017-10-31 DIAGNOSIS — E119 Type 2 diabetes mellitus without complications: Secondary | ICD-10-CM | POA: Diagnosis not present

## 2017-11-09 DIAGNOSIS — F329 Major depressive disorder, single episode, unspecified: Secondary | ICD-10-CM | POA: Diagnosis not present

## 2017-12-01 DIAGNOSIS — E119 Type 2 diabetes mellitus without complications: Secondary | ICD-10-CM | POA: Diagnosis not present

## 2018-01-01 DIAGNOSIS — E119 Type 2 diabetes mellitus without complications: Secondary | ICD-10-CM | POA: Diagnosis not present

## 2018-01-14 DIAGNOSIS — F411 Generalized anxiety disorder: Secondary | ICD-10-CM

## 2018-01-14 DIAGNOSIS — F329 Major depressive disorder, single episode, unspecified: Secondary | ICD-10-CM | POA: Insufficient documentation

## 2018-01-14 DIAGNOSIS — G4700 Insomnia, unspecified: Secondary | ICD-10-CM | POA: Insufficient documentation

## 2018-01-31 DIAGNOSIS — E119 Type 2 diabetes mellitus without complications: Secondary | ICD-10-CM | POA: Diagnosis not present

## 2018-02-01 ENCOUNTER — Ambulatory Visit: Payer: No Typology Code available for payment source | Admitting: Physician Assistant

## 2018-02-01 ENCOUNTER — Encounter: Payer: Self-pay | Admitting: Physician Assistant

## 2018-02-01 DIAGNOSIS — F411 Generalized anxiety disorder: Secondary | ICD-10-CM

## 2018-02-01 DIAGNOSIS — F331 Major depressive disorder, recurrent, moderate: Secondary | ICD-10-CM

## 2018-02-01 MED ORDER — BUSPIRONE HCL 15 MG PO TABS: ORAL_TABLET | ORAL | 0 refills | Status: DC

## 2018-02-01 MED ORDER — DULOXETINE HCL 60 MG PO CPEP: ORAL_CAPSULE | ORAL | 0 refills | Status: DC

## 2018-02-01 MED ORDER — ARIPIPRAZOLE 5 MG PO TABS: ORAL_TABLET | ORAL | 0 refills | Status: DC

## 2018-02-01 MED ORDER — BUPROPION HCL ER (XL) 150 MG PO TB24: 150.0000 mg | ORAL_TABLET | Freq: Every day | ORAL | 0 refills | Status: DC

## 2018-02-01 MED ORDER — ALPRAZOLAM ER 0.5 MG PO TB24: 0.5000 mg | ORAL_TABLET | Freq: Every day | ORAL | 0 refills | Status: DC | PRN

## 2018-02-01 NOTE — Progress Notes (Signed)
[facility]  Patient ID: Timothy Hammond,  MRN: [id number]  PCP: Rodrigo Ran, MD  Date of Evaluation: 02/01/2018 Time spent:15 minutes   HISTORY/CURRENT STATUS: HPI  Not doing well.  Is having more anxiety.  Generalized nervousness, worse when he has nothing to do.  Things have been slow at work and that affects him. He'd rather be busy.  Has racing thoughts worse in the evening, preventing him from going to sleep.  Not sleeping well.  Might get 3 hours of sleep.  Worse in past 2 weeks, when the anxiety worsened.  Before that, was sleeping about 6 hours. The Ambien usually works but not in the past few weeks.  He feels that the Ativan isn't doing anything anymore. "Even when the Xanax wasn't working very well, it's still better than the Ativan."  Not having panic attacks.  Not really depressed.  He's able to enjoy things when he has something to do.  Likes golfing but it's been too hot to play.  Energy and motivation are okay.  Feels that the Abilify hasn't helped at all.  Individual Medical History/ Review of Systems: Changes? :No  Allergies: Patient has no known allergies.  Current Medications:  Current Outpatient Medications:  .  ALPRAZolam (XANAX XR) 0.5 MG 24 hr tablet, Take 1 tablet (0.5 mg total) by mouth 5 (five) times daily as needed for anxiety., Disp: 150 tablet, Rfl: 0 .  ARIPiprazole (ABILIFY) 5 MG tablet, Take 1/2 tablet daily for a week and then stop., Disp: 5 tablet, Rfl: 0 .  aspirin EC 325 MG EC tablet, Take 1 tablet (325 mg total) by mouth 2 (two) times daily after a meal., Disp: 30 tablet, Rfl: 0 .  buPROPion (WELLBUTRIN XL) 150 MG 24 hr tablet, Take 1 tablet (150 mg total) by mouth daily., Disp: 90 tablet, Rfl: 0 .  busPIRone (BUSPAR) 15 MG tablet, Take 2 pills twice a day., Disp: 360 tablet, Rfl: 0 .  DULoxetine (CYMBALTA) 60 MG capsule, Take 2 pills every morning., Disp: 180 capsule, Rfl: 0 .  metFORMIN (GLUCOPHAGE) 500 MG tablet, Take 500 mg by mouth 2  (two) times daily with a meal., Disp: , Rfl:  .  methocarbamol (ROBAXIN) 500 MG tablet, Take 1 tablet (500 mg total) by mouth every 6 (six) hours as needed for muscle spasms., Disp: 50 tablet, Rfl: 0 .  metoprolol succinate (TOPROL XL) 50 MG 24 hr tablet, Take 50 mg by mouth daily. Take with or immediately following a meal., Disp: , Rfl:  .  NIFEdipine (PROCARDIA XL/ADALAT-CC) 90 MG 24 hr tablet, Take 90 mg by mouth daily., Disp: , Rfl:  .  oxyCODONE-acetaminophen (ROXICET) 5-325 MG tablet, Take 1-2 tablets by mouth every 4 (four) hours as needed for severe pain., Disp: 50 tablet, Rfl: 0 .  pravastatin (PRAVACHOL) 40 MG tablet, Take 40 mg by mouth daily., Disp: , Rfl:  .  zolpidem (AMBIEN) 5 MG tablet, Take 5 mg by mouth at bedtime as needed for sleep. , Disp: , Rfl:  Medication Side Effects: None  Family Medical/ Social History: Changes? No  MENTAL HEALTH EXAM:  There were no vitals taken for this visit.There is no height or weight on file to calculate BMI.  General Appearance: Well Groomed  Eye Contact:  Good  Speech:  Clear and Coherent  Volume:  Normal  Mood:  Euthymic  Affect:  Appropriate  Thought Process:  Goal Directed  Orientation:  Full (Time, Place, and Person)  Thought Content: Logical  Suicidal Thoughts:  No  Homicidal Thoughts:  No  Memory:  Immediate  Judgement:  Good  Insight:  Good  Psychomotor Activity:  Normal  Concentration:  Concentration: Good  Recall:  Good  Fund of Knowledge: Good  Language: Good  Akathisia:  No  AIMS (if indicated): not done  Assets:  Communication Skills  ADL's:  Intact  Cognition: WNL  Prognosis:  Good    DIAGNOSES:    ICD-10-CM   1. Generalized anxiety disorder F41.1   2. Major depressive disorder, recurrent episode, moderate (HCC) F33.1     RECOMMENDATIONS: We are stopping the Ativan and changing to Xanax 0.5 mg he can take up to 5 times a day as needed.  Sedation precautions given.  He will wean off the Abilify taking half  p.o. daily for 1 week and then stop.  For now we will continue Wellbutrin.  We did discuss that this can possibly increase anxiety, even though he has been on it for a while.  We will revisit this at a future visit.  Increase BuSpar to 30 mg twice daily.  Continue Cymbalta 60 mg 2 p.o. Daily.  Continue Ambien 10 mg p.o. nightly as needed. We discussed sleep hygiene and ways to prevent and treat anxiety other than medications. Return office visit in approximately 4 weeks.   Melony Overly, PA-C

## 2018-02-03 ENCOUNTER — Telehealth: Payer: Self-pay | Admitting: Physician Assistant

## 2018-02-03 NOTE — Telephone Encounter (Signed)
Pt called and said that he needs a Prior Authorization for his xanax. So he wants [place] to call the [facility] [phone]

## 2018-02-03 NOTE — Telephone Encounter (Signed)
Submitted prior authorization, waiting on approval

## 2018-02-06 NOTE — Telephone Encounter (Signed)
This was denied. Do you want me to tell him to try goodrx and pay out of pocket? I think its his age.    I have his chart if you need it.   Thanks Rosey Bath!

## 2018-02-06 NOTE — Telephone Encounter (Signed)
Yes, use GoodRx

## 2018-02-06 NOTE — Telephone Encounter (Signed)
Pt aware to use goodrx and pay out of pocket. Verbalized understanding.

## 2018-03-02 ENCOUNTER — Other Ambulatory Visit: Payer: Self-pay

## 2018-03-02 MED ORDER — RISPERIDONE 0.5 MG PO TBDP: 0.5000 mg | ORAL_TABLET | Freq: Every day | ORAL | 0 refills | Status: DC

## 2018-03-03 DIAGNOSIS — E119 Type 2 diabetes mellitus without complications: Secondary | ICD-10-CM | POA: Diagnosis not present

## 2018-03-16 ENCOUNTER — Encounter: Payer: Self-pay | Admitting: Physician Assistant

## 2018-03-16 ENCOUNTER — Ambulatory Visit: Payer: Medicare Other | Admitting: Physician Assistant

## 2018-03-16 DIAGNOSIS — G4700 Insomnia, unspecified: Secondary | ICD-10-CM

## 2018-03-16 DIAGNOSIS — F331 Major depressive disorder, recurrent, moderate: Secondary | ICD-10-CM | POA: Diagnosis not present

## 2018-03-16 DIAGNOSIS — F411 Generalized anxiety disorder: Secondary | ICD-10-CM | POA: Diagnosis not present

## 2018-03-16 MED ORDER — RISPERIDONE 1 MG PO TABS: 1.0000 mg | ORAL_TABLET | Freq: Every day | ORAL | 1 refills | Status: DC

## 2018-03-16 NOTE — Patient Instructions (Addendum)
Start taking Risperdal 0.5mg , 2 pills in the evening. Then when you get the next prescription filled, the dose will be 1mg .   Buspar 15mg  take 3 times a day  Continue all other meds except stop the Xanax, as you've already weaned off.

## 2018-03-16 NOTE — Progress Notes (Signed)
[facility]  Patient ID: Timothy Hammond,  MRN: [id number]  PCP: Rodrigo Ran, MD  Date of Evaluation: 03/16/2018 Time spent:15 minutes  Chief Complaint:  Chief Complaint    Follow-up; Anxiety      HISTORY/CURRENT STATUS: HPI Here for 6 week med.  We started Risperdal and weaned him off the Wellbutrin.  States he is feeling quite a bit better.  He is especially able to sleep more, usually around 6-1/2 hours but states it is really good sleep which has been unusual for him.  He does not often wake up during the night now at all.  The anxiety is still a bit of a problem and states that Xanax does not work well.  He is weaned himself off of the Xanax and is going back to the Ativan would be helpful.  Overall, he is better with the anxiety but when he gets in a crowd or in a place where he feels closed in, he gets more anxious.  There is an overall sense of itchiness, no palpitations, sweating, shortness of breath, tachycardia, or other physical symptoms.  Patient denies loss of interest in usual activities and is able to enjoy things.  Denies decreased energy or motivation.  Appetite has not changed.  No extreme sadness, tearfulness, or feelings of hopelessness.  Denies any changes in concentration, making decisions or remembering things.  Denies suicidal or homicidal thoughts.His work has picked back up and staying busy is good for him.  Individual Medical History/ Review of Systems: Changes? :No    Past medications for mental health diagnoses include: Celexa, Xanax, Prozac, Lunesta, Ambien, Paxil, Wellbutrin, Cymbalta, Ativan, BuSpar, Klonopin  Allergies: Patient has no known allergies.  Current Medications:  Current Outpatient Medications:  .  aspirin EC 325 MG EC tablet, Take 1 tablet (325 mg total) by mouth 2 (two) times daily after a meal., Disp: 30 tablet, Rfl: 0 .  DULoxetine (CYMBALTA) 60 MG capsule, Take 2 pills every morning., Disp: 180 capsule, Rfl: 0 .   metFORMIN (GLUCOPHAGE) 500 MG tablet, Take 500 mg by mouth 2 (two) times daily with a meal., Disp: , Rfl:  .  metoprolol succinate (TOPROL XL) 50 MG 24 hr tablet, Take 50 mg by mouth daily. Take with or immediately following a meal., Disp: , Rfl:  .  NIFEdipine (PROCARDIA XL/ADALAT-CC) 90 MG 24 hr tablet, Take 90 mg by mouth daily., Disp: , Rfl:  .  risperiDONE (RISPERDAL M-TAB) 0.5 MG disintegrating tablet, Take 1 tablet (0.5 mg total) by mouth at bedtime., Disp: 30 tablet, Rfl: 0 .  zolpidem (AMBIEN) 10 MG tablet, Take 10 mg by mouth at bedtime as needed for sleep. , Disp: , Rfl:  .  methocarbamol (ROBAXIN) 500 MG tablet, Take 1 tablet (500 mg total) by mouth every 6 (six) hours as needed for muscle spasms. (Patient not taking: Reported on 03/16/2018), Disp: 50 tablet, Rfl: 0 .  oxyCODONE-acetaminophen (ROXICET) 5-325 MG tablet, Take 1-2 tablets by mouth every 4 (four) hours as needed for severe pain. (Patient not taking: Reported on 03/16/2018), Disp: 50 tablet, Rfl: 0 .  pravastatin (PRAVACHOL) 40 MG tablet, Take 40 mg by mouth daily., Disp: , Rfl:  .  risperiDONE (RISPERDAL) 1 MG tablet, Take 1 tablet (1 mg total) by mouth at bedtime., Disp: 30 tablet, Rfl: 1 Medication Side Effects: none  Family Medical/ Social History: Changes? No  MENTAL HEALTH EXAM:  There were no vitals taken for this visit.There is no height or weight on file  to calculate BMI.  General Appearance: Casual and Well Groomed  Eye Contact:  Good  Speech:  Clear and Coherent  Volume:  Normal  Mood:  Euthymic  Affect:  Appropriate  Thought Process:  Goal Directed  Orientation:  Full (Time, Place, and Person)  Thought Content: Logical   Suicidal Thoughts:  No  Homicidal Thoughts:  No  Memory:  WNL  Judgement:  Good  Insight:  Good  Psychomotor Activity:  Normal  Concentration:  Concentration: Good  Recall:  Good  Fund of Knowledge: Good  Language: Good  Assets:  Desire for Improvement  ADL's:  Intact   Cognition: WNL  Prognosis:  Good    DIAGNOSES:    ICD-10-CM   1. Generalized anxiety disorder F41.1   2. Major depressive disorder, recurrent episode, moderate (HCC) F33.1   3. Insomnia, unspecified type G47.00     Receiving Psychotherapy: No    RECOMMENDATIONS: Increase Risperdal to 1 mg nightly.  I think this will help to sleep even better and the anxiety also. He has already weaned himself off of the Xanax.  At the last visit, he stated that the Xanax worked better than the Ativan and I discussed this with him.  I explained that these drugs are controlled substance and I do not want to continue going back and forth.  If I do give him another benzo, he will have to bring the original one back into the office for us to take to the police station and disposed of properly.  He states that he does not have many of the Xanax left and he is using those to continue weaning so he would not have need to bring.  We agree to increase the Risperdal as above will also increase the BuSpar 15 mg to 3 times a day think those changes will help with the anxiety.  (He was supposed to already be at a total of 45 mg a day, which I increased to 60 mg total a day at last visit.  He states he is only been doing 15 mg twice a day.)  He verbalizes understanding of these instructions. Continue all other medications. Return in 6 weeks.   Melony Overlyeresa , PA-C

## 2018-04-06 DIAGNOSIS — E7849 Other hyperlipidemia: Secondary | ICD-10-CM | POA: Diagnosis not present

## 2018-04-06 DIAGNOSIS — Z125 Encounter for screening for malignant neoplasm of prostate: Secondary | ICD-10-CM | POA: Diagnosis not present

## 2018-04-06 DIAGNOSIS — R82998 Other abnormal findings in urine: Secondary | ICD-10-CM | POA: Diagnosis not present

## 2018-04-06 DIAGNOSIS — I10 Essential (primary) hypertension: Secondary | ICD-10-CM | POA: Diagnosis not present

## 2018-04-06 DIAGNOSIS — E119 Type 2 diabetes mellitus without complications: Secondary | ICD-10-CM | POA: Diagnosis not present

## 2018-04-13 DIAGNOSIS — I10 Essential (primary) hypertension: Secondary | ICD-10-CM | POA: Diagnosis not present

## 2018-04-13 DIAGNOSIS — N3281 Overactive bladder: Secondary | ICD-10-CM | POA: Diagnosis not present

## 2018-04-13 DIAGNOSIS — F3289 Other specified depressive episodes: Secondary | ICD-10-CM | POA: Diagnosis not present

## 2018-04-13 DIAGNOSIS — Z0000 Encounter for general adult medical examination without abnormal findings: Secondary | ICD-10-CM | POA: Diagnosis not present

## 2018-04-14 ENCOUNTER — Other Ambulatory Visit: Payer: Self-pay | Admitting: Internal Medicine

## 2018-04-14 DIAGNOSIS — E785 Hyperlipidemia, unspecified: Secondary | ICD-10-CM

## 2018-04-19 ENCOUNTER — Other Ambulatory Visit: Payer: Self-pay | Admitting: Physician Assistant

## 2018-05-02 ENCOUNTER — Ambulatory Visit: Payer: No Typology Code available for payment source | Admitting: Physician Assistant

## 2018-05-02 ENCOUNTER — Encounter: Payer: Self-pay | Admitting: Physician Assistant

## 2018-05-02 DIAGNOSIS — F331 Major depressive disorder, recurrent, moderate: Secondary | ICD-10-CM

## 2018-05-02 DIAGNOSIS — F411 Generalized anxiety disorder: Secondary | ICD-10-CM

## 2018-05-02 DIAGNOSIS — G4700 Insomnia, unspecified: Secondary | ICD-10-CM | POA: Diagnosis not present

## 2018-05-02 MED ORDER — RISPERIDONE 1 MG PO TABS: 1.0000 mg | ORAL_TABLET | Freq: Every day | ORAL | 1 refills | Status: DC

## 2018-05-02 MED ORDER — DULOXETINE HCL 60 MG PO CPEP: 120.0000 mg | ORAL_CAPSULE | Freq: Every day | ORAL | 3 refills | Status: DC

## 2018-05-02 MED ORDER — BUSPIRONE HCL 15 MG PO TABS: 15.0000 mg | ORAL_TABLET | Freq: Three times a day (TID) | ORAL | 1 refills | Status: DC

## 2018-05-02 MED ORDER — ZOLPIDEM TARTRATE 10 MG PO TABS: 10.0000 mg | ORAL_TABLET | Freq: Every evening | ORAL | 5 refills | Status: DC | PRN

## 2018-05-02 NOTE — Progress Notes (Signed)
[facility]  Patient ID: Timothy Hammond D Kawa,  MRN: [id number]  PCP: Rodrigo RanPerini, Mark, MD  Date of Evaluation: 05/02/2018 Time spent:15 minutes  Chief Complaint:  Chief Complaint    Follow-up      HISTORY/CURRENT STATUS: HPI here for 6-week follow-up for anxiety.  "I feel better than I have been 10 years!  I still have some anxiety attacks but they are very few and far between and not nearly as bad as they were.  They are not as intense."  The only major problem he has is severe dry mouth.  He is also taking an over-the-counter antihistamine off and on and knows that that could also play a part.  We had discussed last time about him increasing the BuSpar to a total of 45 mg/day.  He states he forgot we talked about that and has still been doing 15 mg twice daily and has been wondering if he could increase it.  He is no longer taking a benzodiazepine.  He weaned himself off and has not had any withdrawals.  He has been off of it for about a month.  He is sleeping well.  Much better than a few months back.  He gets about 6 to 6-1/2 hours of sleep which is really good for him.  He feels rested the next day.  He is still working, driving and delivering auto parts.  Things are slow right now but that is normal for this time of the year he states.  Patient denies loss of interest in usual activities and is able to enjoy things.  Denies decreased energy or motivation.  Appetite has not changed.  No extreme sadness, tearfulness, or feelings of hopelessness.  Denies any changes in concentration, making decisions or remembering things.  Denies suicidal or homicidal thoughts.  Individual Medical History/ Review of Systems: Changes? :No    Past medications for mental health diagnoses include: Celexa, Xanax, Prozac, Lunesta, Ambien, Paxil, Wellbutrin, Cymbalta, Ativan, BuSpar, Klonopin  Allergies: Patient has no known allergies.  Current Medications:  Current Outpatient Medications:  .   busPIRone (BUSPAR) 15 MG tablet, Take 15 mg by mouth 2 (two) times daily., Disp: , Rfl:  .  DULoxetine (CYMBALTA) 60 MG capsule, Take 2 pills every morning., Disp: 180 capsule, Rfl: 0 .  metFORMIN (GLUCOPHAGE) 500 MG tablet, Take 500 mg by mouth 2 (two) times daily with a meal., Disp: , Rfl:  .  metoprolol succinate (TOPROL XL) 50 MG 24 hr tablet, Take 50 mg by mouth daily. Take with or immediately following a meal., Disp: , Rfl:  .  NIFEdipine (PROCARDIA XL/ADALAT-CC) 90 MG 24 hr tablet, Take 90 mg by mouth daily., Disp: , Rfl:  .  risperiDONE (RISPERDAL) 1 MG tablet, Take 1 tablet (1 mg total) by mouth at bedtime., Disp: 30 tablet, Rfl: 1 .  zolpidem (AMBIEN) 10 MG tablet, Take 10 mg by mouth at bedtime as needed for sleep. , Disp: , Rfl:  .  busPIRone (BUSPAR) 15 MG tablet, Take 1 tablet (15 mg total) by mouth 3 (three) times daily., Disp: 270 tablet, Rfl: 1 .  DULoxetine (CYMBALTA) 60 MG capsule, Take 2 capsules (120 mg total) by mouth daily., Disp: 180 capsule, Rfl: 3 .  risperiDONE (RISPERDAL) 1 MG tablet, Take 1 tablet (1 mg total) by mouth at bedtime., Disp: 30 tablet, Rfl: 1 .  zolpidem (AMBIEN) 10 MG tablet, Take 1 tablet (10 mg total) by mouth at bedtime as needed for sleep., Disp: 30 tablet, Rfl:  5 Medication Side Effects: dry mouth  Family Medical/ Social History: Changes? No  MENTAL HEALTH EXAM:  There were no vitals taken for this visit.There is no height or weight on file to calculate BMI.  General Appearance: Casual, Well Groomed and Obese  Eye Contact:  Good  Speech:  Clear and Coherent  Volume:  Normal  Mood:  Euthymic  Affect:  Appropriate  Thought Process:  Goal Directed  Orientation:  Full (Time, Place, and Person)  Thought Content: Logical   Suicidal Thoughts:  No  Homicidal Thoughts:  No  Memory:  WNL  Judgement:  Good  Insight:  Good  Psychomotor Activity:  Normal  Concentration:  Concentration: Good  Recall:  Good  Fund of Knowledge: Good  Language: Good   Assets:  Desire for Improvement  ADL's:  Intact  Cognition: WNL  Prognosis:  Good    DIAGNOSES:    ICD-10-CM   1. Generalized anxiety disorder F41.1   2. Major depressive disorder, recurrent episode, moderate (HCC) F33.1   3. Insomnia, unspecified type G47.00     Receiving Psychotherapy: No    RECOMMENDATIONS: Glad to see him doing so well! Increase BuSpar 15 mg to 3 times daily.  He verbalizes understanding. Continue Risperdal 1 mg nightly. Continue Ambien 10 mg 1 nightly as needed sleep. Continue Cymbalta 60 mg 2 every morning. Recommend he see his dentist for the dry mouth.  He needs to go anyway as he is overdue. Drink plenty of water to help with the dry mouth as well.  Also lemon drops may help but he will need to be careful with the sugar content.  Use sugar-free if at all possible. Return in 6 weeks or sooner as needed.   Melony Overlyeresa Maiah Sinning, PA-C

## 2018-05-03 DIAGNOSIS — E119 Type 2 diabetes mellitus without complications: Secondary | ICD-10-CM | POA: Diagnosis not present

## 2018-05-04 ENCOUNTER — Telehealth: Payer: Self-pay | Admitting: Physician Assistant

## 2018-05-04 NOTE — Telephone Encounter (Signed)
Pt states he's been off his xanax but now wanting to go back on? He was just here 05/02/2018.   Please advise

## 2018-05-04 NOTE — Telephone Encounter (Signed)
No.  He was doing great 2 days ago.  Stay on current tx b/c it's working!

## 2018-05-04 NOTE — Telephone Encounter (Signed)
Patient had weaned self off Xanax. Wanted to know can he go back on it. He uses the [facility] on [address]. Chart in box if needed.

## 2018-05-05 NOTE — Telephone Encounter (Signed)
Spoke with pt and he said he did take 1 xanax and it did help with his anxiety. He said the anxiety just hit him all of a sudden. Verbalized he will try not to take more.

## 2018-05-08 ENCOUNTER — Telehealth: Payer: Self-pay | Admitting: Physician Assistant

## 2018-05-08 NOTE — Telephone Encounter (Signed)
Pt. Called and said that he is not doing well his anxiety is high and he would like to have xanax. Please reconsider and let him have it.

## 2018-05-08 NOTE — Telephone Encounter (Signed)
Please call him and remind him to increase the Buspar 15 mg to tid, if he hasn't already.  I've given those instructions twice before and he hasn't done it.  Send in xanax 0.5 mg #30 1 bid prn.  No RF use sparingly.

## 2018-05-09 ENCOUNTER — Other Ambulatory Visit: Payer: Self-pay

## 2018-05-09 MED ORDER — ALPRAZOLAM 0.5 MG PO TABS: 0.5000 mg | ORAL_TABLET | Freq: Two times a day (BID) | ORAL | 0 refills | Status: DC | PRN

## 2018-05-09 NOTE — Telephone Encounter (Signed)
Didn't I do this yesterday?  I'm not sure why I'm getting this.

## 2018-05-09 NOTE — Telephone Encounter (Signed)
Spoke with pt and given him instructions again to increase buspar. Let him know Xanax will be sent to his pharmacy but he must use sparingly. Asked to be sent to [facility] on [address].   Pt verbalized understanding.

## 2018-06-13 ENCOUNTER — Ambulatory Visit: Payer: Medicare Other | Admitting: Physician Assistant

## 2018-06-13 ENCOUNTER — Encounter: Payer: Self-pay | Admitting: Physician Assistant

## 2018-06-13 DIAGNOSIS — G4700 Insomnia, unspecified: Secondary | ICD-10-CM | POA: Diagnosis not present

## 2018-06-13 DIAGNOSIS — F411 Generalized anxiety disorder: Secondary | ICD-10-CM

## 2018-06-13 DIAGNOSIS — F331 Major depressive disorder, recurrent, moderate: Secondary | ICD-10-CM

## 2018-06-13 MED ORDER — RISPERIDONE 1 MG PO TABS: 1.0000 mg | ORAL_TABLET | Freq: Every day | ORAL | 1 refills | Status: DC

## 2018-06-13 MED ORDER — ALPRAZOLAM 0.5 MG PO TABS: 0.5000 mg | ORAL_TABLET | Freq: Three times a day (TID) | ORAL | 1 refills | Status: DC | PRN

## 2018-06-13 NOTE — Progress Notes (Signed)
[facility]  Patient ID: Timothy Hammond,  MRN: [id number]  PCP: Rodrigo Ran, MD  Date of Evaluation: 06/13/2018 Time spent:15 minutes  Chief Complaint:  Chief Complaint    Anxiety; Follow-up      HISTORY/CURRENT STATUS: HPI here for follow-up of anxiety.  About 4 days after his last visit, he called because he was having more anxiety.  States it hit him like a ton of bricks.  He was out playing golf a few days before that and the anxiety hit and never improved.  He had been off of the Xanax for about a month at that point.  Initially at the golf course, he had a panic attack with palpitations, sweaty palms and felt a little dizzy for maybe half an hour but that improved.  However the generalized anxiety did not get better and was pretty constant until he got back on Xanax.  He states he is felt much better although the prescription I gave him has not lasted long enough.  He feels like he needs 2 or 3 Xanax a day versus the 1 that he can take.  In the past, he has taken it like that.  Patient denies loss of interest in usual activities and is able to enjoy things.  Denies decreased energy or motivation.  Appetite has not changed.  No extreme sadness, tearfulness, or feelings of hopelessness.  Denies any changes in concentration, making decisions or remembering things.  Denies suicidal or homicidal thoughts.  He still sleeps well as long as he has the Ambien.  Denies muscle or joint pain, stiffness, or dystonia.  Denies dizziness, syncope, seizures, numbness, tingling, tremor, tics, unsteady gait, slurred speech, confusion.   Individual Medical History/ Review of Systems: Changes? :No    Past medications for mental health diagnoses include: Celexa, Xanax, Prozac, Lunesta, Ambien, Paxil, Wellbutrin, Cymbalta, Ativan, BuSpar, Klonopin  Allergies: Patient has no known allergies.  Current Medications:  Current Outpatient Medications:  .  ALPRAZolam (XANAX) 0.5 MG  tablet, Take 1 tablet (0.5 mg total) by mouth 2 (two) times daily as needed for anxiety., Disp: 30 tablet, Rfl: 0 .  busPIRone (BUSPAR) 15 MG tablet, Take 1 tablet (15 mg total) by mouth 3 (three) times daily., Disp: 270 tablet, Rfl: 1 .  DULoxetine (CYMBALTA) 60 MG capsule, Take 2 capsules (120 mg total) by mouth daily., Disp: 180 capsule, Rfl: 3 .  metFORMIN (GLUCOPHAGE) 500 MG tablet, Take 500 mg by mouth 2 (two) times daily with a meal., Disp: , Rfl:  .  metoprolol succinate (TOPROL XL) 50 MG 24 hr tablet, Take 50 mg by mouth daily. Take with or immediately following a meal., Disp: , Rfl:  .  NIFEdipine (PROCARDIA XL/ADALAT-CC) 90 MG 24 hr tablet, Take 90 mg by mouth daily., Disp: , Rfl:  .  risperiDONE (RISPERDAL) 1 MG tablet, Take 1 tablet (1 mg total) by mouth at bedtime., Disp: 30 tablet, Rfl: 1 .  zolpidem (AMBIEN) 10 MG tablet, Take 10 mg by mouth at bedtime as needed for sleep. , Disp: , Rfl:  .  ALPRAZolam (XANAX) 0.5 MG tablet, Take 1 tablet (0.5 mg total) by mouth 3 (three) times daily as needed for anxiety., Disp: 270 tablet, Rfl: 1 .  risperiDONE (RISPERDAL) 1 MG tablet, Take 1 tablet (1 mg total) by mouth at bedtime., Disp: 90 tablet, Rfl: 1 .  zolpidem (AMBIEN) 10 MG tablet, Take 1 tablet (10 mg total) by mouth at bedtime as needed for sleep., Disp: 30 tablet,  Rfl: 5 Medication Side Effects: none  Family Medical/ Social History: Changes? No  MENTAL HEALTH EXAM:  There were no vitals taken for this visit.There is no height or weight on file to calculate BMI.  General Appearance: Casual, Well Groomed and Obese  Eye Contact:  Good  Speech:  Clear and Coherent  Volume:  Normal  Mood:  Euthymic  Affect:  Appropriate  Thought Process:  Goal Directed  Orientation:  Full (Time, Place, and Person)  Thought Content: Logical   Suicidal Thoughts:  No  Homicidal Thoughts:  No  Memory:  WNL  Judgement:  Good  Insight:  Good  Psychomotor Activity:  Normal  Concentration:   Concentration: Good  Recall:  Good  Fund of Knowledge: Good  Language: Good  Assets:  Desire for Improvement  ADL's:  Intact  Cognition: WNL  Prognosis:  Good    DIAGNOSES:    ICD-10-CM   1. GAD (generalized anxiety disorder) F41.1   2. Major depressive disorder, recurrent episode, moderate (HCC) F33.1   3. Insomnia, unspecified type G47.00     Receiving Psychotherapy: No    RECOMMENDATIONS: I have reviewed PDMP. Continue Xanax 0.5 mg 1 3 times daily as needed.  We had a long discussion concerning the possible correlation of benzodiazepines with dementia.  He states he is willing to take that risk because it makes him feel better in the present. Continue BuSpar 15 mg 3 times daily. Continue Cymbalta 120 mg daily. Continue Risperdal 1 mg nightly. Continue Ambien 10 mg nightly as needed. Return in 3 months or sooner as needed.   Melony Overly, PA-C

## 2018-07-02 DIAGNOSIS — E119 Type 2 diabetes mellitus without complications: Secondary | ICD-10-CM | POA: Diagnosis not present

## 2018-08-02 DIAGNOSIS — E119 Type 2 diabetes mellitus without complications: Secondary | ICD-10-CM | POA: Diagnosis not present

## 2018-09-01 DIAGNOSIS — E119 Type 2 diabetes mellitus without complications: Secondary | ICD-10-CM | POA: Diagnosis not present

## 2018-09-05 DIAGNOSIS — G4733 Obstructive sleep apnea (adult) (pediatric): Secondary | ICD-10-CM | POA: Diagnosis not present

## 2018-09-11 ENCOUNTER — Ambulatory Visit: Payer: Medicare Other | Admitting: Physician Assistant

## 2018-09-11 ENCOUNTER — Encounter: Payer: Self-pay | Admitting: Physician Assistant

## 2018-09-11 ENCOUNTER — Other Ambulatory Visit: Payer: Self-pay

## 2018-09-11 DIAGNOSIS — F411 Generalized anxiety disorder: Secondary | ICD-10-CM | POA: Diagnosis not present

## 2018-09-11 DIAGNOSIS — F331 Major depressive disorder, recurrent, moderate: Secondary | ICD-10-CM | POA: Diagnosis not present

## 2018-09-11 DIAGNOSIS — G4700 Insomnia, unspecified: Secondary | ICD-10-CM

## 2018-09-11 MED ORDER — ALPRAZOLAM 0.5 MG PO TABS: 0.5000 mg | ORAL_TABLET | Freq: Three times a day (TID) | ORAL | 0 refills | Status: DC | PRN

## 2018-09-11 MED ORDER — BUSPIRONE HCL 30 MG PO TABS: 30.0000 mg | ORAL_TABLET | Freq: Two times a day (BID) | ORAL | 0 refills | Status: DC

## 2018-09-11 MED ORDER — ZOLPIDEM TARTRATE 10 MG PO TABS: 10.0000 mg | ORAL_TABLET | Freq: Every evening | ORAL | 0 refills | Status: DC | PRN

## 2018-09-11 NOTE — Progress Notes (Signed)
[facility]  Patient ID: Timothy Hammond,  MRN: [id number]  PCP: Rodrigo Ran, MD  Date of Evaluation: 09/11/2018 Time spent:15 minutes  Chief Complaint:  Chief Complaint    Anxiety; Follow-up     Virtual Visit via Telephone Note  I connected with patient by a video enabled telemedicine application or telephone, with their informed consent, and verified patient privacy and that I am speaking with the correct person using two identifiers.  I am private, in my home and the patient is home.  I discussed the limitations, risks, security and privacy concerns of performing an evaluation and management service by telephone and the availability of in person appointments. I also discussed with the patient that there may be a patient responsible charge related to this service. The patient expressed understanding and agreed to proceed.   I discussed the assessment and treatment plan with the patient. The patient was provided an opportunity to ask questions and all were answered. The patient agreed with the plan and demonstrated an understanding of the instructions.   The patient was advised to call back or seek an in-person evaluation if the symptoms worsen or if the condition fails to improve as anticipated.  I provided 15 minutes of non-face-to-face time during this encounter.  HISTORY/CURRENT STATUS: HPI 64-month med check.  Patient states that he is doing well overall.  But with the coronavirus pandemic and the fact that he is not working as much has caused him to be more anxious.  He feels like he might need another Xanax per day some days but he has not taken it because he wanted to discuss with me first.  He just feels jittery inside and he cannot quiet his mind.  If it was not for that, he feels like he would be doing well.  He sleeps well most of the time.  He does have to take the Ambien the majority of the time.  He is able to enjoy things.  Energy and motivation are  good.  He denies suicidal or homicidal thoughts.  He is not isolating any more than he has to due to the coronavirus.  Denies dizziness, syncope, seizures, numbness, tingling, tremor, tics, unsteady gait, slurred speech, confusion.  Denies muscle or joint pain, stiffness, or dystonia.  Individual Medical History/ Review of Systems: Changes? :No    Past medications for mental health diagnoses include: Celexa, Xanax, Prozac, Lunesta, Ambien, Paxil, Wellbutrin, Cymbalta, Ativan, BuSpar, Klonopin  Allergies: Patient has no known allergies.  Current Medications:  Current Outpatient Medications:  .  ALPRAZolam (XANAX) 0.5 MG tablet, Take 1 tablet (0.5 mg total) by mouth 3 (three) times daily as needed for anxiety. With occasional extra 1 qd prn, Disp: 300 tablet, Rfl: 0 .  DULoxetine (CYMBALTA) 60 MG capsule, Take 2 capsules (120 mg total) by mouth daily., Disp: 180 capsule, Rfl: 3 .  metFORMIN (GLUCOPHAGE) 500 MG tablet, Take 500 mg by mouth 2 (two) times daily with a meal., Disp: , Rfl:  .  metoprolol succinate (TOPROL XL) 50 MG 24 hr tablet, Take 50 mg by mouth daily. Take with or immediately following a meal., Disp: , Rfl:  .  NIFEdipine (PROCARDIA XL/ADALAT-CC) 90 MG 24 hr tablet, Take 90 mg by mouth daily., Disp: , Rfl:  .  risperiDONE (RISPERDAL) 1 MG tablet, Take 1 tablet (1 mg total) by mouth at bedtime., Disp: 90 tablet, Rfl: 1 .  zolpidem (AMBIEN) 10 MG tablet, Take 1 tablet (10 mg total) by mouth at  bedtime as needed for sleep., Disp: 90 tablet, Rfl: 0 .  busPIRone (BUSPAR) 30 MG tablet, Take 1 tablet (30 mg total) by mouth 2 (two) times daily., Disp: 180 tablet, Rfl: 0 .  zolpidem (AMBIEN) 10 MG tablet, Take 1 tablet (10 mg total) by mouth at bedtime as needed for sleep., Disp: 30 tablet, Rfl: 5 Medication Side Effects: none  Family Medical/ Social History: Changes? Yes work is slow right now.   MENTAL HEALTH EXAM:  There were no vitals taken for this visit.There is no height or  weight on file to calculate BMI.  General Appearance: Unable to assess  Eye Contact:  Unable to assess  Speech:  Clear and Coherent  Volume:  Normal  Mood:  Euthymic  Affect:  Unable to assess  Thought Process:  Goal Directed  Orientation:  Full (Time, Place, and Person)  Thought Content: Logical   Suicidal Thoughts:  No  Homicidal Thoughts:  No  Memory:  WNL  Judgement:  Intact  Insight:  Good  Psychomotor Activity:  Unable to assess  Concentration:  Concentration: Good  Recall:  Good  Fund of Knowledge: Good  Language: Good  Assets:  Desire for Improvement  ADL's:  Intact  Cognition: WNL  Prognosis:  Good    DIAGNOSES:    ICD-10-CM   1. GAD (generalized anxiety disorder) F41.1   2. Major depressive disorder, recurrent episode, moderate (HCC) F33.1   3. Insomnia, unspecified type G47.00     Receiving Psychotherapy: No    RECOMMENDATIONS:  Increase BuSpar to 30 mg p.o. twice daily. Continue Xanax 0.5 mg 1 p.o. 3 times daily as needed with an occasional extra daily as needed.  We discussed tolerance, addiction, possibility of dementia that is dose related, falls, etc. he understands and accepts. Continue Cymbalta 60 mg, 2 p.o. daily. Continue Risperdal 1 mg nightly. Continue Ambien 10 mg nightly as needed. Discussed coping techniques. Return in 3 months.  Melony Overlyeresa Adiel Erney, PA-C   This record has been created using [employer] software.  Chart creation errors have been sought, but may not always have been located and corrected. Such creation errors do not reflect on the standard of medical care.

## 2018-10-02 DIAGNOSIS — E119 Type 2 diabetes mellitus without complications: Secondary | ICD-10-CM | POA: Diagnosis not present

## 2018-10-11 DIAGNOSIS — E1129 Type 2 diabetes mellitus with other diabetic kidney complication: Secondary | ICD-10-CM | POA: Diagnosis not present

## 2018-10-11 DIAGNOSIS — F329 Major depressive disorder, single episode, unspecified: Secondary | ICD-10-CM | POA: Diagnosis not present

## 2018-10-11 DIAGNOSIS — N3281 Overactive bladder: Secondary | ICD-10-CM | POA: Diagnosis not present

## 2018-10-11 DIAGNOSIS — N183 Chronic kidney disease, stage 3 (moderate): Secondary | ICD-10-CM | POA: Diagnosis not present

## 2018-10-12 DIAGNOSIS — E1129 Type 2 diabetes mellitus with other diabetic kidney complication: Secondary | ICD-10-CM | POA: Diagnosis not present

## 2018-10-31 ENCOUNTER — Other Ambulatory Visit: Payer: Self-pay | Admitting: Physician Assistant

## 2018-10-31 NOTE — Telephone Encounter (Signed)
Spoke with pharmacist and it was on file they will get it filled for pick up

## 2018-10-31 NOTE — Telephone Encounter (Signed)
Should still be on file, they are good for 6 months. Will call pharmacy to check

## 2018-10-31 NOTE — Telephone Encounter (Signed)
Timothy Hammond called to report that he needs to refill on his Ambien.  He never picked up the one dated 09/11/18.  [facility] says they don't have a prescription.  Has it expired?  Please send in a new RX.  Next appt 12/12/18

## 2018-11-01 DIAGNOSIS — E119 Type 2 diabetes mellitus without complications: Secondary | ICD-10-CM | POA: Diagnosis not present

## 2018-12-11 ENCOUNTER — Other Ambulatory Visit: Payer: Self-pay | Admitting: Physician Assistant

## 2018-12-12 ENCOUNTER — Ambulatory Visit: Payer: No Typology Code available for payment source | Admitting: Physician Assistant

## 2018-12-14 ENCOUNTER — Other Ambulatory Visit: Payer: Self-pay | Admitting: Physician Assistant

## 2019-01-02 ENCOUNTER — Other Ambulatory Visit: Payer: Self-pay | Admitting: Physician Assistant

## 2019-01-08 ENCOUNTER — Other Ambulatory Visit: Payer: Self-pay | Admitting: Physician Assistant

## 2019-01-09 NOTE — Telephone Encounter (Signed)
Has appt 09/18

## 2019-01-19 ENCOUNTER — Ambulatory Visit: Payer: Medicare Other | Admitting: Physician Assistant

## 2019-01-19 ENCOUNTER — Other Ambulatory Visit: Payer: Self-pay | Admitting: Physician Assistant

## 2019-01-22 NOTE — Telephone Encounter (Signed)
appt 10/14 

## 2019-02-01 DIAGNOSIS — Z23 Encounter for immunization: Secondary | ICD-10-CM | POA: Diagnosis not present

## 2019-02-14 ENCOUNTER — Ambulatory Visit: Payer: Medicare Other | Admitting: Physician Assistant

## 2019-03-06 DIAGNOSIS — R351 Nocturia: Secondary | ICD-10-CM | POA: Diagnosis not present

## 2019-03-14 ENCOUNTER — Ambulatory Visit (INDEPENDENT_AMBULATORY_CARE_PROVIDER_SITE_OTHER): Payer: Medicare Other | Admitting: Physician Assistant

## 2019-03-14 ENCOUNTER — Other Ambulatory Visit: Payer: Self-pay

## 2019-03-14 ENCOUNTER — Encounter: Payer: Self-pay | Admitting: Physician Assistant

## 2019-03-14 DIAGNOSIS — G4700 Insomnia, unspecified: Secondary | ICD-10-CM

## 2019-03-14 DIAGNOSIS — F331 Major depressive disorder, recurrent, moderate: Secondary | ICD-10-CM

## 2019-03-14 DIAGNOSIS — F411 Generalized anxiety disorder: Secondary | ICD-10-CM

## 2019-03-14 MED ORDER — DULOXETINE HCL 60 MG PO CPEP: ORAL_CAPSULE | ORAL | 1 refills | Status: DC

## 2019-03-14 MED ORDER — BUSPIRONE HCL 30 MG PO TABS: 30.0000 mg | ORAL_TABLET | Freq: Two times a day (BID) | ORAL | 1 refills | Status: DC

## 2019-03-14 MED ORDER — ALPRAZOLAM 0.5 MG PO TABS: 0.5000 mg | ORAL_TABLET | Freq: Four times a day (QID) | ORAL | 5 refills | Status: DC | PRN

## 2019-03-14 MED ORDER — RISPERIDONE 1 MG PO TABS: 1.0000 mg | ORAL_TABLET | Freq: Every day | ORAL | 1 refills | Status: DC

## 2019-03-14 MED ORDER — ZOLPIDEM TARTRATE 10 MG PO TABS: 10.0000 mg | ORAL_TABLET | Freq: Every evening | ORAL | 1 refills | Status: DC | PRN

## 2019-03-14 NOTE — Progress Notes (Signed)
[facility]  Patient ID: Timothy Hammond,  MRN: [id number]  PCP: Rodrigo Ran, MD  Date of Evaluation: 03/14/2019 Time spent:15 minutes  Chief Complaint:  Chief Complaint    Anxiety; Depression; Insomnia; Follow-up      HISTORY/CURRENT STATUS: HPI 6 month med check.  Patient states that he is doing well overall.  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  He feels that the Xanax is not lasting as long as it did.  Especially if he is going over bridges or is on the road that has steep drop off, he almost freezes with anxiety.  He has been taking the Xanax 3 times a day and it had been helpful for a long time but now, not so much.  If it was not for that, he feels like he would be doing well.  He sleeps well most of the time.  He does have to take the Ambien the majority of the time.  He is able to enjoy things.  Energy and motivation are good.  He denies suicidal or homicidal thoughts.  He is not isolating any more than he has to due to the coronavirus.  Denies dizziness, syncope, seizures, numbness, tingling, tremor, tics, unsteady gait, slurred speech, confusion.  Denies muscle or joint pain, stiffness, or dystonia.  Individual Medical History/ Review of Systems: Changes? :No    Past medications for mental health diagnoses include: Celexa, Xanax, Prozac, Lunesta, Ambien, Paxil, Wellbutrin, Cymbalta, Ativan, BuSpar, Klonopin  Allergies: Patient has no known allergies.  Current Medications:  Current Outpatient Medications:  .  busPIRone (BUSPAR) 30 MG tablet, Take 1 tablet (30 mg total) by mouth 2 (two) times daily., Disp: 180 tablet, Rfl: 1 .  DULoxetine (CYMBALTA) 60 MG capsule, TAKE 2 CAPSULES BY MOUTH ONCE DAILY IN THE MORNING, Disp: 180 capsule, Rfl: 1 .  metFORMIN (GLUCOPHAGE) 500 MG tablet, Take 500 mg by mouth 2 (two) times daily with a meal., Disp: , Rfl:   .  metoprolol succinate (TOPROL XL) 50 MG 24 hr tablet, Take 50 mg by mouth daily. Take with or immediately following a meal., Disp: , Rfl:  .  NIFEdipine (PROCARDIA XL/ADALAT-CC) 90 MG 24 hr tablet, Take 90 mg by mouth daily., Disp: , Rfl:  .  risperiDONE (RISPERDAL) 1 MG tablet, Take 1 tablet (1 mg total) by mouth at bedtime., Disp: 90 tablet, Rfl: 1 .  zolpidem (AMBIEN) 10 MG tablet, Take 1 tablet (10 mg total) by mouth at bedtime as needed. for sleep, Disp: 90 tablet, Rfl: 1 .  [START ON 03/23/2019] ALPRAZolam (XANAX) 0.5 MG tablet, Take 1 tablet (0.5 mg total) by mouth 4 (four) times daily as needed for anxiety., Disp: 120 tablet, Rfl: 5 .  zolpidem (AMBIEN) 10 MG tablet, Take 1 tablet (10 mg total) by mouth at bedtime as needed for sleep., Disp: 30 tablet, Rfl: 5 Medication Side Effects: none  Family Medical/ Social History: Changes?  No  MENTAL HEALTH EXAM:  There were no vitals taken for this visit.There is no height or weight on file to calculate BMI.  General Appearance: Casual, Neat and Well Groomed  Eye Contact:  Good  Speech:  Clear and Coherent  Volume:  Normal  Mood:  Euthymic  Affect:  Appropriate  Thought Process:  Goal Directed and Descriptions of Associations: Intact  Orientation:  Full (Time, Place, and  Person)  Thought Content: Logical   Suicidal Thoughts:  No  Homicidal Thoughts:  No  Memory:  WNL  Judgement:  Intact  Insight:  Good  Psychomotor Activity:  Normal  Concentration:  Concentration: Good  Recall:  Good  Fund of Knowledge: Good  Language: Good  Assets:  Desire for Improvement  ADL's:  Intact  Cognition: WNL  Prognosis:  Good    DIAGNOSES:    ICD-10-CM   1. GAD (generalized anxiety disorder)  F41.1   2. Major depressive disorder, recurrent episode, moderate (HCC)  F33.1   3. Insomnia, unspecified type  G47.00     Receiving Psychotherapy: No    RECOMMENDATIONS:  Continue BuSpar to 30 mg p.o. twice daily. Increase Xanax 0.5 mg 1 p.o. 4  times daily as needed.  We discussed tolerance, addiction, possibility of dementia that is dose related, falls, etc. he understands and accepts. Continue Cymbalta 60 mg, 2 p.o. daily. Continue Risperdal 1 mg nightly. Continue Ambien 10 mg nightly as needed. Discussed coping techniques. Return in 6 months.  Donnal Moat, PA-C   This record has been created using [employer].  Chart creation errors have been sought, but may not always have been located and corrected. Such creation errors do not reflect on the standard of medical care.

## 2019-03-16 DIAGNOSIS — G4733 Obstructive sleep apnea (adult) (pediatric): Secondary | ICD-10-CM | POA: Diagnosis not present

## 2019-05-04 DIAGNOSIS — E119 Type 2 diabetes mellitus without complications: Secondary | ICD-10-CM | POA: Diagnosis not present

## 2019-05-18 DIAGNOSIS — Z125 Encounter for screening for malignant neoplasm of prostate: Secondary | ICD-10-CM | POA: Diagnosis not present

## 2019-05-18 DIAGNOSIS — E7849 Other hyperlipidemia: Secondary | ICD-10-CM | POA: Diagnosis not present

## 2019-05-18 DIAGNOSIS — E1129 Type 2 diabetes mellitus with other diabetic kidney complication: Secondary | ICD-10-CM | POA: Diagnosis not present

## 2019-05-22 DIAGNOSIS — R82998 Other abnormal findings in urine: Secondary | ICD-10-CM | POA: Diagnosis not present

## 2019-05-22 DIAGNOSIS — I10 Essential (primary) hypertension: Secondary | ICD-10-CM | POA: Diagnosis not present

## 2019-05-25 DIAGNOSIS — E1129 Type 2 diabetes mellitus with other diabetic kidney complication: Secondary | ICD-10-CM | POA: Diagnosis not present

## 2019-05-25 DIAGNOSIS — N183 Chronic kidney disease, stage 3 unspecified: Secondary | ICD-10-CM | POA: Diagnosis not present

## 2019-05-25 DIAGNOSIS — Z0000 Encounter for general adult medical examination without abnormal findings: Secondary | ICD-10-CM | POA: Diagnosis not present

## 2019-05-25 DIAGNOSIS — N3281 Overactive bladder: Secondary | ICD-10-CM | POA: Diagnosis not present

## 2019-06-01 ENCOUNTER — Ambulatory Visit: Payer: Medicare Other

## 2019-06-04 ENCOUNTER — Other Ambulatory Visit: Payer: Self-pay | Admitting: Internal Medicine

## 2019-06-04 DIAGNOSIS — E785 Hyperlipidemia, unspecified: Secondary | ICD-10-CM

## 2019-06-09 ENCOUNTER — Ambulatory Visit: Payer: Medicare Other | Attending: Internal Medicine

## 2019-06-09 DIAGNOSIS — Z23 Encounter for immunization: Secondary | ICD-10-CM

## 2019-06-09 NOTE — Progress Notes (Signed)
   Covid-19 Vaccination [facility]  Name:  Timothy Hammond    MRN: [id number] DOB: 12/22/1950  06/09/2019  Mr. [name] was observed post Covid-19 immunization for 15 minutes without incidence. He was provided with Vaccine Information Sheet and instruction to access the V-Safe system.   Mr. [name] was instructed to call 911 with any severe reactions post vaccine: Marland Kitchen Difficulty breathing  . Swelling of your face and throat  . A fast heartbeat  . A bad rash all over your body  . Dizziness and weakness    Immunizations Administered    Name Date Dose VIS Date Route   Pfizer COVID-19 Vaccine 06/09/2019  4:36 PM 0.3 mL 04/13/2019 Intramuscular   Manufacturer: [employer], [employer]   Lot: [id number]   NDC: [address]-5

## 2019-06-12 ENCOUNTER — Ambulatory Visit: Payer: Medicare Other

## 2019-07-04 ENCOUNTER — Ambulatory Visit: Payer: Medicare Other | Attending: Internal Medicine

## 2019-07-04 DIAGNOSIS — Z23 Encounter for immunization: Secondary | ICD-10-CM

## 2019-07-04 NOTE — Progress Notes (Signed)
   Covid-19 Vaccination Clinic  Name:  MAXIMINO COZZOLINO    MRN: [id number] DOB: 1950-05-28  07/04/2019  Mr. Reidel was observed post Covid-19 immunization for 15 minutes without incident. He was provided with Vaccine Information Sheet and instruction to access the V-Safe system.   Mr. Memmer was instructed to call 911 with any severe reactions post vaccine: Marland Kitchen Difficulty breathing  . Swelling of face and throat  . A fast heartbeat  . A bad rash all over body  . Dizziness and weakness   Immunizations Administered    Name Date Dose VIS Date Route   Pfizer COVID-19 Vaccine 07/04/2019 12:39 PM 0.3 mL 04/13/2019 Intramuscular   Manufacturer: [employer], [employer]   Lot: [id number]   NDC: [address]-5

## 2019-08-02 ENCOUNTER — Other Ambulatory Visit: Payer: Self-pay | Admitting: Physician Assistant

## 2019-09-12 ENCOUNTER — Ambulatory Visit (INDEPENDENT_AMBULATORY_CARE_PROVIDER_SITE_OTHER): Payer: Medicare Other | Admitting: Physician Assistant

## 2019-09-12 ENCOUNTER — Other Ambulatory Visit: Payer: Self-pay

## 2019-09-12 ENCOUNTER — Encounter: Payer: Self-pay | Admitting: Physician Assistant

## 2019-09-12 DIAGNOSIS — F411 Generalized anxiety disorder: Secondary | ICD-10-CM | POA: Diagnosis not present

## 2019-09-12 DIAGNOSIS — G4700 Insomnia, unspecified: Secondary | ICD-10-CM

## 2019-09-12 DIAGNOSIS — F329 Major depressive disorder, single episode, unspecified: Secondary | ICD-10-CM | POA: Diagnosis not present

## 2019-09-12 MED ORDER — ALPRAZOLAM 0.5 MG PO TABS: 0.5000 mg | ORAL_TABLET | Freq: Four times a day (QID) | ORAL | 5 refills | Status: DC | PRN

## 2019-09-12 MED ORDER — RISPERIDONE 1 MG PO TABS: 1.0000 mg | ORAL_TABLET | Freq: Every day | ORAL | 1 refills | Status: DC

## 2019-09-12 MED ORDER — DULOXETINE HCL 60 MG PO CPEP: ORAL_CAPSULE | ORAL | 1 refills | Status: DC

## 2019-09-12 MED ORDER — ZOLPIDEM TARTRATE 10 MG PO TABS: 10.0000 mg | ORAL_TABLET | Freq: Every evening | ORAL | 5 refills | Status: DC | PRN

## 2019-09-12 MED ORDER — BUPROPION HCL ER (XL) 150 MG PO TB24: 150.0000 mg | ORAL_TABLET | Freq: Every day | ORAL | 1 refills | Status: DC

## 2019-09-12 MED ORDER — BUSPIRONE HCL 30 MG PO TABS: 30.0000 mg | ORAL_TABLET | Freq: Two times a day (BID) | ORAL | 1 refills | Status: DC

## 2019-09-12 NOTE — Progress Notes (Signed)
[facility]  Patient ID: Timothy Hammond,  MRN: [id number]  PCP: Crist Infante, MD  Date of Evaluation: 09/12/2019 Time spent:20 minutes  Chief Complaint:  Chief Complaint    Anxiety; Depression; Insomnia      HISTORY/CURRENT STATUS: HPI 6 month med check.  States he has been doing really well up until about a month ago.  He was having problems with his car and had to buy a new one.  And then his work decreased a lot and he is not making as much as he was.  He is unsure if it will pick back up.  He is concerned that he will not be able to make the car payments.  He is not anxious about it he feels that the anxiety is well controlled.  But he will suddenly feel really depressed thinking that he might lose his car or something.  There is usually no trigger when that happens.  Reports that he is able to enjoy things.  Energy and motivation are low.  He is not isolating.  Not crying easily.  "It strange that all feel suddenly sad and down."  He denies suicidal or homicidal thoughts.    Patient denies increased energy with decreased need for sleep, no increased talkativeness, no racing thoughts, no impulsivity or risky behaviors, no increased spending, no increased libido, no grandiosity, no increased irritability or anger, and no hallucinations.  Anxiety is well controlled with the Xanax.  He does still need it despite taking the Cymbalta, BuSpar, and Risperdal.  Not having panic attacks now but if he does not take the Xanax, he will become more anxious throughout the day.  He is sleeping well with the Ambien.  Denies dizziness, syncope, seizures, numbness, tingling, tremor, tics, unsteady gait, slurred speech, confusion.  Denies muscle or joint pain, stiffness, or dystonia.  Individual Medical History/ Review of Systems: Changes? :No    Past medications for mental health diagnoses include: Celexa, Xanax, Prozac, Lunesta, Ambien, Paxil, Wellbutrin, Cymbalta, Ativan, BuSpar,  Klonopin  Allergies: Patient has no known allergies.  Current Medications:  Current Outpatient Medications:  .  ALPRAZolam (XANAX) 0.5 MG tablet, Take 1 tablet (0.5 mg total) by mouth 4 (four) times daily as needed for anxiety., Disp: 120 tablet, Rfl: 5 .  busPIRone (BUSPAR) 30 MG tablet, Take 1 tablet (30 mg total) by mouth 2 (two) times daily., Disp: 180 tablet, Rfl: 1 .  DULoxetine (CYMBALTA) 60 MG capsule, TAKE 2 CAPSULES BY MOUTH ONCE DAILY IN THE MORNING, Disp: 180 capsule, Rfl: 1 .  metFORMIN (GLUCOPHAGE) 500 MG tablet, Take 500 mg by mouth 2 (two) times daily with a meal., Disp: , Rfl:  .  metoprolol succinate (TOPROL XL) 50 MG 24 hr tablet, Take 50 mg by mouth daily. Take with or immediately following a meal., Disp: , Rfl:  .  NIFEdipine (PROCARDIA XL/ADALAT-CC) 90 MG 24 hr tablet, Take 90 mg by mouth daily., Disp: , Rfl:  .  risperiDONE (RISPERDAL) 1 MG tablet, Take 1 tablet (1 mg total) by mouth at bedtime., Disp: 90 tablet, Rfl: 1 .  zolpidem (AMBIEN) 10 MG tablet, Take 1 tablet (10 mg total) by mouth at bedtime as needed. for sleep, Disp: 30 tablet, Rfl: 5 .  buPROPion (WELLBUTRIN XL) 150 MG 24 hr tablet, Take 1 tablet (150 mg total) by mouth daily., Disp: 30 tablet, Rfl: 1 .  zolpidem (AMBIEN) 10 MG tablet, Take 1 tablet (10 mg total) by mouth at bedtime as needed for sleep.,  Disp: 30 tablet, Rfl: 5 Medication Side Effects: none  Family Medical/ Social History: Changes?  No  MENTAL HEALTH EXAM:  There were no vitals taken for this visit.There is no height or weight on file to calculate BMI.  General Appearance: Casual, Neat and Well Groomed  Eye Contact:  Good  Speech:  Clear and Coherent  Volume:  Normal  Mood:  Euthymic  Affect:  Appropriate  Thought Process:  Goal Directed and Descriptions of Associations: Intact  Orientation:  Full (Time, Place, and Person)  Thought Content: Logical   Suicidal Thoughts:  No  Homicidal Thoughts:  No  Memory:  WNL  Judgement:   Intact  Insight:  Good  Psychomotor Activity:  Normal  Concentration:  Concentration: Good  Recall:  Good  Fund of Knowledge: Good  Language: Good  Assets:  Desire for Improvement  ADL's:  Intact  Cognition: WNL  Prognosis:  Good    DIAGNOSES:    ICD-10-CM   1. Reactive depression  F32.9   2. Generalized anxiety disorder  F41.1   3. Insomnia, unspecified type  G47.00     Receiving Psychotherapy: No    RECOMMENDATIONS: PDMP was reviewed. I spent 20 minutes with him. We discussed the depression.  I do believe it situational and wants his job needs him more, he will feel a lot better.  However I am concerned about lack of energy and motivation and think it would be wise to retry the Wellbutrin.  He does not remember why it was stopped and I do not have those records.  But he is willing to try it again.  Discussed benefits, risks, side effects and he accepts. Start Wellbutrin XL 150 mg, 1 p.o. every morning. Continue BuSpar to 30 mg p.o. twice daily. Continue Xanax 0.5 mg, 1 p.o. 4 times daily as needed.  Continue Cymbalta 60 mg, 2 p.o. daily. Continue Risperdal 1 mg nightly. Continue Ambien 10 mg nightly as needed. Consider counseling. Return in 4 to 6 weeks.  Melony Overly, PA-C

## 2019-10-10 DIAGNOSIS — G4733 Obstructive sleep apnea (adult) (pediatric): Secondary | ICD-10-CM | POA: Diagnosis not present

## 2019-10-11 ENCOUNTER — Encounter: Payer: Self-pay | Admitting: Physician Assistant

## 2019-10-11 ENCOUNTER — Telehealth (INDEPENDENT_AMBULATORY_CARE_PROVIDER_SITE_OTHER): Payer: Medicare Other | Admitting: Physician Assistant

## 2019-10-11 ENCOUNTER — Telehealth: Payer: Self-pay | Admitting: Physician Assistant

## 2019-10-11 DIAGNOSIS — G4700 Insomnia, unspecified: Secondary | ICD-10-CM | POA: Diagnosis not present

## 2019-10-11 DIAGNOSIS — F411 Generalized anxiety disorder: Secondary | ICD-10-CM | POA: Diagnosis not present

## 2019-10-11 DIAGNOSIS — F329 Major depressive disorder, single episode, unspecified: Secondary | ICD-10-CM

## 2019-10-11 MED ORDER — BUPROPION HCL ER (XL) 150 MG PO TB24: 150.0000 mg | ORAL_TABLET | Freq: Every day | ORAL | 4 refills | Status: DC

## 2019-10-11 MED ORDER — ZOLPIDEM TARTRATE 10 MG PO TABS: 10.0000 mg | ORAL_TABLET | Freq: Every evening | ORAL | 4 refills | Status: DC | PRN

## 2019-10-11 NOTE — Telephone Encounter (Signed)
    [name], schaffert are scheduled for a virtual visit with your provider today.    Just as we do with appointments in the office, we must obtain your consent to participate.  Your consent will be active for this visit and any virtual visit you may have with one of our providers in the next 365 days.    If you have a MyChart account, I can also send a copy of this consent to you electronically.  All virtual visits are billed to your insurance company just like a traditional visit in the office.  As this is a virtual visit, video technology does not allow for your provider to perform a traditional examination.  This may limit your provider's ability to fully assess your condition.  If your provider identifies any concerns that need to be evaluated in person or the need to arrange testing such as labs, EKG, etc, we will make arrangements to do so.    Although advances in technology are sophisticated, we cannot ensure that it will always work on either your end or our end.  If the connection with a video visit is poor, we may have to switch to a telephone visit.  With either a video or telephone visit, we are not always able to ensure that we have a secure connection.   I need to obtain your verbal consent now.   Are you willing to proceed with your visit today?   Timothy Hammond has provided verbal consent on 10/11/2019 for a virtual visit (video or telephone).   Melony Overly, PA-C 10/11/2019  9:58 AM

## 2019-10-11 NOTE — Progress Notes (Signed)
[facility]  Patient ID: Timothy Hammond,  MRN: [id number]  PCP: Rodrigo Ran, MD  Date of Evaluation: 10/11/2019 Time spent:20 minutes  Chief Complaint:  Chief Complaint    Follow-up      Virtual Visit via Telephone or Video Note  I connected with patient by a video enabled telemedicine application or telephone, with their informed consent, and verified patient privacy and that I am speaking with the correct person using two identifiers.  I am private, in my office and the patient is home work.  I discussed the limitations, risks, security and privacy concerns of performing an evaluation and management service by telephone and the availability of in person appointments. I also discussed with the patient that there may be a patient responsible charge related to this service. The patient expressed understanding and agreed to proceed.   I discussed the assessment and treatment plan with the patient. The patient was provided an opportunity to ask questions and all were answered. The patient agreed with the plan and demonstrated an understanding of the instructions.   The patient was advised to call back or seek an in-person evaluation if the symptoms worsen or if the condition fails to improve as anticipated.  I provided 20 minutes of non-face-to-face time during this encounter.   HISTORY/CURRENT STATUS: For follow-up after starting Wellbutrin.  At last visit 4 weeks ago, we added Wellbutrin because I have low energy and motivation and feeling "blue."  He states he is feeling a lot better and is having no side effects from the Wellbutrin.  He feels more energetic, more like himself, and able to enjoy things now.  The Wellbutrin has not made the anxiety any worse.  He continues to need Xanax and it is helpful when he uses it.  He needs it on a daily basis, mostly for generalized anxiety, not panic attacks.  He denies suicidal thoughts.  The only problem now is insomnia.  "Do  not get me wrong, it is a lot better.  But I still have times where I wake up a lot.  Even with taking the Ambien."  It so much better than it was though he feels like we should leave everything the same.  Patient denies increased energy with decreased need for sleep, no increased talkativeness, no racing thoughts, no impulsivity or risky behaviors, no increased spending, no increased libido, no grandiosity, no increased irritability or anger, and no hallucinations.  Denies dizziness, syncope, seizures, numbness, tingling, tremor, tics, unsteady gait, slurred speech, confusion.  Denies muscle or joint pain, stiffness, or dystonia.  Individual Medical History/ Review of Systems: Changes? :No    Past medications for mental health diagnoses include: Celexa, Xanax, Prozac, Lunesta, Ambien, Paxil, Wellbutrin, Cymbalta, Ativan, BuSpar, Klonopin  Allergies: Patient has no known allergies.  Current Medications:  Current Outpatient Medications:  .  ALPRAZolam (XANAX) 0.5 MG tablet, Take 1 tablet (0.5 mg total) by mouth 4 (four) times daily as needed for anxiety., Disp: 120 tablet, Rfl: 5 .  buPROPion (WELLBUTRIN XL) 150 MG 24 hr tablet, Take 1 tablet (150 mg total) by mouth daily., Disp: 30 tablet, Rfl: 4 .  busPIRone (BUSPAR) 30 MG tablet, Take 1 tablet (30 mg total) by mouth 2 (two) times daily., Disp: 180 tablet, Rfl: 1 .  DULoxetine (CYMBALTA) 60 MG capsule, TAKE 2 CAPSULES BY MOUTH ONCE DAILY IN THE MORNING, Disp: 180 capsule, Rfl: 1 .  metFORMIN (GLUCOPHAGE) 500 MG tablet, Take 500 mg by mouth 2 (two) times daily  with a meal., Disp: , Rfl:  .  metoprolol succinate (TOPROL XL) 50 MG 24 hr tablet, Take 50 mg by mouth daily. Take with or immediately following a meal., Disp: , Rfl:  .  NIFEdipine (PROCARDIA XL/ADALAT-CC) 90 MG 24 hr tablet, Take 90 mg by mouth daily., Disp: , Rfl:  .  risperiDONE (RISPERDAL) 1 MG tablet, Take 1 tablet (1 mg total) by mouth at bedtime., Disp: 90 tablet, Rfl: 1 .   zolpidem (AMBIEN) 10 MG tablet, Take 1 tablet (10 mg total) by mouth at bedtime as needed. for sleep, Disp: 30 tablet, Rfl: 4 Medication Side Effects: none  Family Medical/ Social History: Changes?  No  MENTAL HEALTH EXAM:  There were no vitals taken for this visit.There is no height or weight on file to calculate BMI.  General Appearance: unable to assess  Eye Contact:  unable to assess  Speech:  Clear and Coherent  Volume:  Normal  Mood:  Euthymic  Affect:  Unable to assess  Thought Process:  Goal Directed and Descriptions of Associations: Intact  Orientation:  Full (Time, Place, and Person)  Thought Content: Logical   Suicidal Thoughts:  No  Homicidal Thoughts:  No  Memory:  WNL  Judgement:  Intact  Insight:  Good  Psychomotor Activity:  Unable to assess  Concentration:  Concentration: Good  Recall:  Good  Fund of Knowledge: Good  Language: Good  Assets:  Desire for Improvement  ADL's:  Intact  Cognition: WNL  Prognosis:  Good    DIAGNOSES:    ICD-10-CM   1. GAD (generalized anxiety disorder)  F41.1   2. Reactive depression  F32.9   3. Insomnia, unspecified type  G47.00     Receiving Psychotherapy: No    RECOMMENDATIONS: PDMP was reviewed. I spent 20 minutes with him. I am glad to see he is doing better!  We did discuss the fact that we can increase the Wellbutrin if needed.  If he is not even better in about 2 weeks, which would be a total of 6 weeks on this dose, he can call back and I will increase the Wellbutrin to 300 mg.  If he is doing well, continue the same dose.  He understands. Sleep hygiene. Continue Wellbutrin XL 150 mg, 1 p.o. every morning. Continue BuSpar to 30 mg p.o. twice daily. Continue Xanax 0.5 mg, 1 p.o. 4 times daily as needed.  Continue Cymbalta 60 mg, 2 p.o. daily. Continue Risperdal 1 mg nightly. Continue Ambien 10 mg nightly as needed. Consider counseling. Return in 3 months.  Donnal Moat, PA-C

## 2019-10-24 DIAGNOSIS — H8110 Benign paroxysmal vertigo, unspecified ear: Secondary | ICD-10-CM | POA: Diagnosis not present

## 2019-10-24 DIAGNOSIS — I129 Hypertensive chronic kidney disease with stage 1 through stage 4 chronic kidney disease, or unspecified chronic kidney disease: Secondary | ICD-10-CM | POA: Diagnosis not present

## 2019-10-24 DIAGNOSIS — N182 Chronic kidney disease, stage 2 (mild): Secondary | ICD-10-CM | POA: Diagnosis not present

## 2019-10-24 DIAGNOSIS — F329 Major depressive disorder, single episode, unspecified: Secondary | ICD-10-CM | POA: Diagnosis not present

## 2019-11-05 ENCOUNTER — Other Ambulatory Visit: Payer: Self-pay | Admitting: Physician Assistant

## 2019-11-21 ENCOUNTER — Ambulatory Visit
Admission: RE | Admit: 2019-11-21 | Discharge: 2019-11-21 | Disposition: A | Discharge status: Home / Self Care | Payer: No Typology Code available for payment source | Source: Ambulatory Visit | Attending: Internal Medicine | Admitting: Internal Medicine

## 2019-11-21 DIAGNOSIS — E785 Hyperlipidemia, unspecified: Secondary | ICD-10-CM

## 2019-11-28 DIAGNOSIS — I2510 Atherosclerotic heart disease of native coronary artery without angina pectoris: Secondary | ICD-10-CM | POA: Diagnosis not present

## 2019-11-28 DIAGNOSIS — E1129 Type 2 diabetes mellitus with other diabetic kidney complication: Secondary | ICD-10-CM | POA: Diagnosis not present

## 2019-11-28 DIAGNOSIS — F329 Major depressive disorder, single episode, unspecified: Secondary | ICD-10-CM | POA: Diagnosis not present

## 2019-11-28 DIAGNOSIS — K219 Gastro-esophageal reflux disease without esophagitis: Secondary | ICD-10-CM | POA: Diagnosis not present

## 2019-12-18 ENCOUNTER — Telehealth: Payer: Self-pay | Admitting: Physician Assistant

## 2019-12-18 NOTE — Telephone Encounter (Signed)
I recommend adding gabapentin which will help treat and prevent the anxiety.  Let me know the pharmacy.  Thanks

## 2019-12-18 NOTE — Telephone Encounter (Signed)
Left detailed message with information and to call back tomorrow after 9 am with a pharmacy and any questions.

## 2019-12-18 NOTE — Telephone Encounter (Signed)
Pt called and said that the Alprazolam isnt working for him anymore. He'd like to change to something else.

## 2019-12-21 ENCOUNTER — Telehealth: Payer: Self-pay | Admitting: Physician Assistant

## 2019-12-21 ENCOUNTER — Other Ambulatory Visit: Payer: Self-pay | Admitting: Physician Assistant

## 2019-12-21 MED ORDER — GABAPENTIN 300 MG PO CAPS: ORAL_CAPSULE | ORAL | 1 refills | Status: DC

## 2019-12-21 NOTE — Telephone Encounter (Signed)
Noted thank you

## 2019-12-21 NOTE — Telephone Encounter (Signed)
Prescription for gabapentin was sent in.

## 2019-12-21 NOTE — Telephone Encounter (Signed)
Send to [facility]

## 2019-12-21 NOTE — Telephone Encounter (Signed)
Pt called back with name of pharmacy to call medication in. Its [facility] on [address], store [phone]

## 2020-01-10 ENCOUNTER — Encounter: Payer: Self-pay | Admitting: Physician Assistant

## 2020-01-10 ENCOUNTER — Ambulatory Visit (INDEPENDENT_AMBULATORY_CARE_PROVIDER_SITE_OTHER): Payer: Medicare Other | Admitting: Physician Assistant

## 2020-01-10 ENCOUNTER — Other Ambulatory Visit: Payer: Self-pay

## 2020-01-10 DIAGNOSIS — G4700 Insomnia, unspecified: Secondary | ICD-10-CM

## 2020-01-10 DIAGNOSIS — F411 Generalized anxiety disorder: Secondary | ICD-10-CM | POA: Diagnosis not present

## 2020-01-10 DIAGNOSIS — N39498 Other specified urinary incontinence: Secondary | ICD-10-CM

## 2020-01-10 DIAGNOSIS — F329 Major depressive disorder, single episode, unspecified: Secondary | ICD-10-CM

## 2020-01-10 NOTE — Progress Notes (Signed)
[facility]  Patient ID: Timothy Hammond,  MRN: [id number]  PCP: Rodrigo Ran, MD  Date of Evaluation: 01/10/2020 Time spent:30 minutes  Chief Complaint:  Chief Complaint    Anxiety; Depression; Follow-up       HISTORY/CURRENT STATUS:  Since we started on Gabapentin about 3 weeks ago, he started having incontinence, wets the bed every night. Denies fever, dysuria, hematuria, increased thirst, abdominal or back pain or any other symptoms that would point toward a UTI.  "I really hate it because the gabapentin is helping me a lot.  But that is the only change that has been made.  The incontinence did not start until I started the gabapentin."  He has been sleeping much much better.  Better than he has in years.  The problem is that he does not wake up in order to go to the bathroom now.  He used to wake up once to go urinate.  He has continued to take the Ambien in the evening, even with taking the gabapentin.  The gabapentin has helped to the point that he is not always needing the Xanax 4 times a day.  At least 3 times a day though he does need it.  He is able to enjoy things.  Energy and motivation are good.  Not isolating.  Work is going well.  Not crying easily.  Memory and focus are unchanged.  No suicidal or homicidal thoughts.  Patient denies increased energy with decreased need for sleep, no increased talkativeness, no racing thoughts, no impulsivity or risky behaviors, no increased spending, no increased libido, no grandiosity, no increased irritability or anger, no paranoia, and no hallucinations.  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  He wonders if there is any way he may not have to serve due to mental health reasons.  Denies dizziness, syncope, seizures, numbness, tingling, tremor, tics, unsteady gait, slurred speech, confusion.  Denies muscle or joint pain, stiffness, or dystonia.  Individual Medical History/ Review of Systems: Changes? :No    Past medications  for mental health diagnoses include: Celexa, Xanax, Prozac, Lunesta, Ambien, Paxil, Wellbutrin, Cymbalta, Ativan, BuSpar, Gabapentin, Klonopin  Allergies: Patient has no known allergies.  Current Medications:  Current Outpatient Medications:  .  ALPRAZolam (XANAX) 0.5 MG tablet, Take 1 tablet (0.5 mg total) by mouth 4 (four) times daily as needed for anxiety., Disp: 120 tablet, Rfl: 5 .  buPROPion (WELLBUTRIN XL) 150 MG 24 hr tablet, Take 1 tablet by mouth once daily, Disp: 30 tablet, Rfl: 5 .  busPIRone (BUSPAR) 30 MG tablet, Take 1 tablet (30 mg total) by mouth 2 (two) times daily., Disp: 180 tablet, Rfl: 1 .  DULoxetine (CYMBALTA) 60 MG capsule, TAKE 2 CAPSULES BY MOUTH ONCE DAILY IN THE MORNING, Disp: 180 capsule, Rfl: 1 .  gabapentin (NEURONTIN) 300 MG capsule, 1 p.o. nightly for 2 nights, then 1 p.o. twice daily. (Patient taking differently: 300 mg 2 (two) times daily. ), Disp: 60 capsule, Rfl: 1 .  metFORMIN (GLUCOPHAGE) 500 MG tablet, Take 500 mg by mouth 2 (two) times daily with a meal., Disp: , Rfl:  .  metoprolol succinate (TOPROL XL) 50 MG 24 hr tablet, Take 50 mg by mouth daily. Take with or immediately following a meal., Disp: , Rfl:  .  NIFEdipine (PROCARDIA XL/ADALAT-CC) 90 MG 24 hr tablet, Take 90 mg by mouth daily., Disp: , Rfl:  .  risperiDONE (RISPERDAL) 1 MG tablet, Take 1 tablet (1 mg total) by mouth at  bedtime., Disp: 90 tablet, Rfl: 1 .  zolpidem (AMBIEN) 10 MG tablet, Take 1 tablet (10 mg total) by mouth at bedtime as needed. for sleep, Disp: 30 tablet, Rfl: 4 Medication Side Effects: none  Family Medical/ Social History: Changes?  No  MENTAL HEALTH EXAM:  There were no vitals taken for this visit.There is no height or weight on file to calculate BMI.  General Appearance: Casual, Neat, Well Groomed and Obese  Eye Contact:  Good  Speech:  Clear and Coherent  Volume:  Normal  Mood:  Euthymic  Affect:  Appropriate  Thought Process:  Goal Directed and Descriptions  of Associations: Intact  Orientation:  Full (Time, Place, and Person)  Thought Content: Logical   Suicidal Thoughts:  No  Homicidal Thoughts:  No  Memory:  WNL  Judgement:  Intact  Insight:  Good  Psychomotor Activity:  Normal  Concentration:  Concentration: Good and Attention Span: Good  Recall:  Good  Fund of Knowledge: Good  Language: Good  Assets:  Desire for Improvement  ADL's:  Intact  Cognition: WNL  Prognosis:  Good    DIAGNOSES:    ICD-10-CM   1. GAD (generalized anxiety disorder)  F41.1   2. Reactive depression  F32.9   3. Insomnia, unspecified type  G47.00   4. Other urinary incontinence  N39.498     Receiving Psychotherapy: No    RECOMMENDATIONS: PDMP was reviewed. I provided 30 minutes of face-to-face time during this encounter. We discussed different options for the gabapentin.  Since he has responded so well and has had no incontinence during the day, I recommend that he take the gabapentin in the morning.  If that is not helpful enough, or does not last long enough, he can take another 1 in the early afternoon.  He will try each of those regimens for 1 week.  If the afternoon dose is not helpful, then he can change it to bedtime dose.  If he does need to do that, he will hold the Ambien.  He has been taking both together and that could be part of the problem.  These directions were typed out on the AVS.  He understands.  Another option in the future may be to decrease the dose of the gabapentin. Excuse from [employer], he'll bring letter, if he has not missed the deadline for an exemption letter.  The excuse will be based on anxiety and depression. He had labs at [facility] about a month ago.  He will get those faxed to me.  Release of information has been signed today. Continue gabapentin 300 mg, 1 p.o. every morning.  After 1 week, he may increase to 1 every morning and 1 p.o. around 1:00.  If after 1 week that is not beneficial, take 1 every  morning and 1 nightly and then hold the Ambien. Continue Wellbutrin XL 150 mg, 1 p.o. every morning. Continue BuSpar to 30 mg p.o. twice daily. Continue Xanax 0.5 mg, 1 p.o. 4 times daily as needed.  Continue Cymbalta 60 mg, 2 p.o. daily. Continue Risperdal 1 mg nightly. Continue Ambien 10 mg nightly as needed. Consider counseling. Return in 6 weeks.  Melony Overly, PA-C

## 2020-01-10 NOTE — Patient Instructions (Signed)
On the gabapentin 300 mg, take 1 every morning for 1 week.  If that does not help the anxiety all day long, then add 1 pill around 1:00.  Take that for 1 week.  If that is not effective enough, then change the afternoon pill to taking it at bedtime.  If you have to do that, hold the Ambien and let see if that will help with the incontinence.

## 2020-01-21 ENCOUNTER — Telehealth: Payer: Self-pay | Admitting: Physician Assistant

## 2020-01-21 NOTE — Telephone Encounter (Signed)
Received a records release from [facility] on [address]. It noted that he was not their patient. He goes to [facility]. I've left several messages for Timothy Hammond to call us back with no response. He has an appt on 10/12. He needs to sign a medical release for [facility] when he comes in.

## 2020-02-02 DIAGNOSIS — Z23 Encounter for immunization: Secondary | ICD-10-CM | POA: Diagnosis not present

## 2020-02-21 ENCOUNTER — Encounter: Payer: Self-pay | Admitting: Physician Assistant

## 2020-02-21 ENCOUNTER — Ambulatory Visit (INDEPENDENT_AMBULATORY_CARE_PROVIDER_SITE_OTHER): Payer: Medicare Other | Admitting: Physician Assistant

## 2020-02-21 ENCOUNTER — Other Ambulatory Visit: Payer: Self-pay

## 2020-02-21 DIAGNOSIS — F3341 Major depressive disorder, recurrent, in partial remission: Secondary | ICD-10-CM

## 2020-02-21 DIAGNOSIS — G4700 Insomnia, unspecified: Secondary | ICD-10-CM

## 2020-02-21 DIAGNOSIS — F411 Generalized anxiety disorder: Secondary | ICD-10-CM | POA: Diagnosis not present

## 2020-02-21 MED ORDER — ZOLPIDEM TARTRATE 10 MG PO TABS: 10.0000 mg | ORAL_TABLET | Freq: Every evening | ORAL | 0 refills | Status: DC | PRN

## 2020-02-21 MED ORDER — GABAPENTIN 300 MG PO CAPS: 300.0000 mg | ORAL_CAPSULE | Freq: Every morning | ORAL | 0 refills | Status: DC

## 2020-02-21 NOTE — Progress Notes (Signed)
[facility]  Patient ID: Timothy Hammond,  MRN: [id number]  PCP: Rodrigo Ran, MD  Date of Evaluation: 02/21/2020 Time spent:20 minutes  Chief Complaint:  Chief Complaint    Anxiety; Depression; Insomnia       HISTORY/CURRENT STATUS:  Timothy Hammond states he is doing very well.  We had to decrease the gabapentin due to urinary incontinence at night.  The incontinence has occurred maybe 1 time since we stopped the evening dose.  He still takes the morning dose and it is very helpful for the anxiety.  The only problem with decreasing the dose is that he is unable to go to sleep as quickly as he did on that night dose of gabapentin.  Even with the Ambien, it takes him a while to fall asleep.  He cannot stop ruminating thoughts while he is trying to go to sleep.  In general the anxiety is much better and he is not having as many panic attacks.  The Xanax is still very helpful.  He does need it 4 times a day on most days.  He is able to enjoy things.  Energy and motivation are good.  Not isolating.  Work is going well.  Not crying easily.  Memory and focus are unchanged.  No suicidal or homicidal thoughts.  Patient denies increased energy with decreased need for sleep, no increased talkativeness, no racing thoughts, no impulsivity or risky behaviors, no increased spending, no increased libido, no grandiosity, no increased irritability or anger, no paranoia, and no hallucinations.  Denies dizziness, syncope, seizures, numbness, tingling, tremor, tics, unsteady gait, slurred speech, confusion.  Denies muscle or joint pain, stiffness, or dystonia.  Individual Medical History/ Review of Systems: Changes? :No    Past medications for mental health diagnoses include: Celexa, Xanax, Prozac, Lunesta, Ambien, Paxil, Wellbutrin, Cymbalta, Ativan, BuSpar, Gabapentin, Klonopin  Allergies: Patient has no known allergies.  Current Medications:  Current Outpatient Medications:    ALPRAZolam  (XANAX) 0.5 MG tablet, Take 1 tablet (0.5 mg total) by mouth 4 (four) times daily as needed for anxiety., Disp: 120 tablet, Rfl: 5   buPROPion (WELLBUTRIN XL) 150 MG 24 hr tablet, Take 1 tablet by mouth once daily, Disp: 30 tablet, Rfl: 5   busPIRone (BUSPAR) 30 MG tablet, Take 1 tablet (30 mg total) by mouth 2 (two) times daily., Disp: 180 tablet, Rfl: 1   DULoxetine (CYMBALTA) 60 MG capsule, TAKE 2 CAPSULES BY MOUTH ONCE DAILY IN THE MORNING, Disp: 180 capsule, Rfl: 1   gabapentin (NEURONTIN) 300 MG capsule, Take 1 capsule (300 mg total) by mouth in the morning., Disp: 90 capsule, Rfl: 0   metFORMIN (GLUCOPHAGE) 500 MG tablet, Take 500 mg by mouth 2 (two) times daily with a meal., Disp: , Rfl:    metoprolol succinate (TOPROL XL) 50 MG 24 hr tablet, Take 50 mg by mouth daily. Take with or immediately following a meal., Disp: , Rfl:    NIFEdipine (PROCARDIA XL/ADALAT-CC) 90 MG 24 hr tablet, Take 90 mg by mouth daily., Disp: , Rfl:    risperiDONE (RISPERDAL) 1 MG tablet, Take 1 tablet (1 mg total) by mouth at bedtime., Disp: 90 tablet, Rfl: 1   zolpidem (AMBIEN) 10 MG tablet, Take 1 tablet (10 mg total) by mouth at bedtime as needed. for sleep, Disp: 90 tablet, Rfl: 0 Medication Side Effects: none  Family Medical/ Social History: Changes?  No  MENTAL HEALTH EXAM:  There were no vitals taken for this visit.There is no height or weight  on file to calculate BMI.  General Appearance: Casual, Neat, Well Groomed and Obese  Eye Contact:  Good  Speech:  Clear and Coherent  Volume:  Normal  Mood:  Euthymic  Affect:  Appropriate  Thought Process:  Goal Directed and Descriptions of Associations: Intact  Orientation:  Full (Time, Place, and Person)  Thought Content: Logical   Suicidal Thoughts:  No  Homicidal Thoughts:  No  Memory:  WNL  Judgement:  Intact  Insight:  Good  Psychomotor Activity:  Normal  Concentration:  Concentration: Good and Attention Span: Good  Recall:  Good  Fund of  Knowledge: Good  Language: Good  Assets:  Desire for Improvement  ADL's:  Intact  Cognition: WNL  Prognosis:  Good    DIAGNOSES:    ICD-10-CM   1. Generalized anxiety disorder  F41.1   2. Recurrent major depressive disorder, in partial remission (HCC)  F33.41   3. Insomnia, unspecified type  G47.00     Receiving Psychotherapy: No    RECOMMENDATIONS: PDMP was reviewed. I provided 20 minutes of face-to-face time during this encounter. I am glad to see him doing so much better!   I agree that it is too bad that he cannot tolerate the gabapentin at night but the incontinence was too much of a problem to continue that. I recommend taking a Xanax maybe an hour to 2 before he wants to go to sleep.  And then he can take the Ambien closer to the time he wants to go to sleep.  Hopefully that will help with the racing thoughts so that he can go to sleep easier. Continue gabapentin 300 mg 1 p.o. every morning. Continue Wellbutrin XL 150 mg, 1 p.o. every morning. Continue BuSpar to 30 mg p.o. twice daily. Continue Xanax 0.5 mg, 1 p.o. 4 times daily as needed.  Continue Cymbalta 60 mg, 2 p.o. daily. Continue Risperdal 1 mg nightly. Continue Ambien 10 mg nightly as needed. Consider counseling. Return in 3 months.  Melony Overly, PA-C

## 2020-04-28 ENCOUNTER — Other Ambulatory Visit: Payer: Self-pay | Admitting: Physician Assistant

## 2020-05-06 ENCOUNTER — Telehealth: Payer: Self-pay

## 2020-05-06 NOTE — Telephone Encounter (Signed)
Prior Authorization submitted and approved for RISPERDAL 1 MG effective 05/05/2020-05/05/2021 with [employer] [place] [employer] Part D. ID# [id number]

## 2020-05-20 DIAGNOSIS — E785 Hyperlipidemia, unspecified: Secondary | ICD-10-CM | POA: Diagnosis not present

## 2020-05-20 DIAGNOSIS — Z125 Encounter for screening for malignant neoplasm of prostate: Secondary | ICD-10-CM | POA: Diagnosis not present

## 2020-05-20 DIAGNOSIS — E1129 Type 2 diabetes mellitus with other diabetic kidney complication: Secondary | ICD-10-CM | POA: Diagnosis not present

## 2020-05-23 ENCOUNTER — Encounter: Payer: Self-pay | Admitting: Physician Assistant

## 2020-05-23 ENCOUNTER — Telehealth (INDEPENDENT_AMBULATORY_CARE_PROVIDER_SITE_OTHER): Payer: Medicare Other | Admitting: Physician Assistant

## 2020-05-23 DIAGNOSIS — F329 Major depressive disorder, single episode, unspecified: Secondary | ICD-10-CM | POA: Diagnosis not present

## 2020-05-23 DIAGNOSIS — E1129 Type 2 diabetes mellitus with other diabetic kidney complication: Secondary | ICD-10-CM | POA: Diagnosis not present

## 2020-05-23 DIAGNOSIS — R82998 Other abnormal findings in urine: Secondary | ICD-10-CM | POA: Diagnosis not present

## 2020-05-23 DIAGNOSIS — Z0000 Encounter for general adult medical examination without abnormal findings: Secondary | ICD-10-CM | POA: Diagnosis not present

## 2020-05-23 DIAGNOSIS — Z1212 Encounter for screening for malignant neoplasm of rectum: Secondary | ICD-10-CM | POA: Diagnosis not present

## 2020-05-23 DIAGNOSIS — F411 Generalized anxiety disorder: Secondary | ICD-10-CM | POA: Diagnosis not present

## 2020-05-23 DIAGNOSIS — F3341 Major depressive disorder, recurrent, in partial remission: Secondary | ICD-10-CM

## 2020-05-23 DIAGNOSIS — E785 Hyperlipidemia, unspecified: Secondary | ICD-10-CM | POA: Diagnosis not present

## 2020-05-23 DIAGNOSIS — G4700 Insomnia, unspecified: Secondary | ICD-10-CM | POA: Diagnosis not present

## 2020-05-23 MED ORDER — ZOLPIDEM TARTRATE 10 MG PO TABS: 10.0000 mg | ORAL_TABLET | Freq: Every evening | ORAL | 1 refills | Status: DC | PRN

## 2020-05-23 MED ORDER — BUPROPION HCL ER (XL) 150 MG PO TB24: 150.0000 mg | ORAL_TABLET | Freq: Every day | ORAL | 1 refills | Status: DC

## 2020-05-23 MED ORDER — ALPRAZOLAM 0.5 MG PO TABS: ORAL_TABLET | ORAL | 5 refills | Status: DC

## 2020-05-23 MED ORDER — GABAPENTIN 300 MG PO CAPS: 300.0000 mg | ORAL_CAPSULE | Freq: Two times a day (BID) | ORAL | 1 refills | Status: DC

## 2020-05-23 MED ORDER — DULOXETINE HCL 60 MG PO CPEP: ORAL_CAPSULE | ORAL | 1 refills | Status: DC

## 2020-05-23 MED ORDER — RISPERIDONE 1 MG PO TABS: 1.0000 mg | ORAL_TABLET | Freq: Every day | ORAL | 1 refills | Status: DC

## 2020-05-23 MED ORDER — BUSPIRONE HCL 30 MG PO TABS: 30.0000 mg | ORAL_TABLET | Freq: Two times a day (BID) | ORAL | 1 refills | Status: DC

## 2020-05-23 NOTE — Progress Notes (Signed)
[facility]  Patient ID: Timothy Hammond,  MRN: [id number]  PCP: Rodrigo Ran, MD  Date of Evaluation: 05/23/2020 Time spent:15 minutes  Chief Complaint:  Chief Complaint    Anxiety; Depression; Insomnia      Virtual Visit via Telehealth  I connected with patient by telephone, with their informed consent, and verified patient privacy and that I am speaking with the correct person using two identifiers.  I am private, in my office and the patient is at home.  I discussed the limitations, risks, security and privacy concerns of performing an evaluation and management service by telephone and the availability of in person appointments. I also discussed with the patient that there may be a patient responsible charge related to this service. The patient expressed understanding and agreed to proceed.   I discussed the assessment and treatment plan with the patient. The patient was provided an opportunity to ask questions and all were answered. The patient agreed with the plan and demonstrated an understanding of the instructions.   The patient was advised to call back or seek an in-person evaluation if the symptoms worsen or if the condition fails to improve as anticipated.  I provided 15 minutes of non-face-to-face time during this encounter.  HISTORY/CURRENT STATUS:  Timothy Hammond is doing really well!  The anxiety is much better controlled than it was last year.  The gabapentin has helped tremendously.  He is only taking it in the morning, due to urinary incontinence at night that was caused by the evening dose.  "I wish I could take it at night because it was more helpful but I do not want that problem again."  Overall he feels like the anxiety is controlled so he does not need the night dose.  Sometimes during the afternoon he feels it would be helpful though.  He is able to enjoy things.  Energy and motivation are good.  Not isolating.  Work is going well.  He continues to drive,  delivering auto parts.  Due to the inclement weather this week, he has not been busy.  Not crying easily.  Memory and focus are unchanged.  Personal hygiene is normal.  Not isolating no suicidal or homicidal thoughts.  Patient denies increased energy with decreased need for sleep, no increased talkativeness, no racing thoughts, no impulsivity or risky behaviors, no increased spending, no increased libido, no grandiosity, no increased irritability or anger, no paranoia, and no hallucinations.  Denies dizziness, syncope, seizures, numbness, tingling, tremor, tics, unsteady gait, slurred speech, confusion.  Denies muscle or joint pain, stiffness, or dystonia.  Individual Medical History/ Review of Systems: Changes? :No    Past medications for mental health diagnoses include: Celexa, Xanax, Prozac, Lunesta, Ambien, Paxil, Wellbutrin, Cymbalta, Ativan, BuSpar, Gabapentin, Klonopin  Allergies: Patient has no known allergies.  Current Medications:  Current Outpatient Medications:  .  metFORMIN (GLUCOPHAGE) 500 MG tablet, Take 500 mg by mouth 2 (two) times daily with a meal., Disp: , Rfl:  .  NIFEdipine (PROCARDIA XL/ADALAT-CC) 90 MG 24 hr tablet, Take 90 mg by mouth daily., Disp: , Rfl:  .  ALPRAZolam (XANAX) 0.5 MG tablet, TAKE 1 TABLET BY MOUTH 4 TIMES DAILY AS NEEDED FOR ANXIETY, Disp: 120 tablet, Rfl: 5 .  buPROPion (WELLBUTRIN XL) 150 MG 24 hr tablet, Take 1 tablet (150 mg total) by mouth daily., Disp: 90 tablet, Rfl: 1 .  busPIRone (BUSPAR) 30 MG tablet, Take 1 tablet (30 mg total) by mouth 2 (two) times daily., Disp: 180  tablet, Rfl: 1 .  DULoxetine (CYMBALTA) 60 MG capsule, TAKE 2 CAPSULES BY MOUTH ONCE DAILY IN THE MORNING, Disp: 180 capsule, Rfl: 1 .  gabapentin (NEURONTIN) 300 MG capsule, Take 1 capsule (300 mg total) by mouth 2 (two) times daily with breakfast and lunch., Disp: 180 capsule, Rfl: 1 .  metoprolol succinate (TOPROL-XL) 50 MG 24 hr tablet, Take 50 mg by mouth daily. Take with  or immediately following a meal. (Patient not taking: Reported on 05/23/2020), Disp: , Rfl:  .  risperiDONE (RISPERDAL) 1 MG tablet, Take 1 tablet (1 mg total) by mouth at bedtime., Disp: 90 tablet, Rfl: 1 .  zolpidem (AMBIEN) 10 MG tablet, Take 1 tablet (10 mg total) by mouth at bedtime as needed. for sleep, Disp: 90 tablet, Rfl: 1 Medication Side Effects: none  Family Medical/ Social History: Changes?  No  MENTAL HEALTH EXAM:  There were no vitals taken for this visit.There is no height or weight on file to calculate BMI.  General Appearance: Casual, Neat, Well Groomed and Obese  Eye Contact:  Good  Speech:  Clear and Coherent  Volume:  Normal  Mood:  Euthymic  Affect:  Appropriate  Thought Process:  Goal Directed and Descriptions of Associations: Intact  Orientation:  Full (Time, Place, and Person)  Thought Content: Logical   Suicidal Thoughts:  No  Homicidal Thoughts:  No  Memory:  WNL  Judgement:  Intact  Insight:  Good  Psychomotor Activity:  Normal  Concentration:  Concentration: Good and Attention Span: Good  Recall:  Good  Fund of Knowledge: Good  Language: Good  Assets:  Desire for Improvement  ADL's:  Intact  Cognition: WNL  Prognosis:  Good    DIAGNOSES:    ICD-10-CM   1. Generalized anxiety disorder  F41.1   2. Recurrent major depressive disorder, in partial remission (HCC)  F33.41   3. Insomnia, unspecified type  G47.00     Receiving Psychotherapy: No    RECOMMENDATIONS: PDMP was reviewed. I provided 15 minutes of face-to-face time during this encounter, specifically discussing the gabapentin and his response to it.  I am glad to see him doing better overall, but recommend having the gabapentin available in the afternoon for as needed use, or he can just take a gabapentin around lunchtime to hopefully prevent the anxiety from occurring.  If he does have urinary incontinence, he will just stop taking the afternoon dose.  He would like to try this. Continue  gabapentin 300 mg every morning and may take 1 at lunch if needed or even routinely. Continue Wellbutrin XL 150 mg, 1 p.o. every morning. Continue BuSpar to 30 mg p.o. twice daily. Continue Xanax 0.5 mg, 1 p.o. 4 times daily as needed.  Continue Cymbalta 60 mg, 2 p.o. daily. Continue Risperdal 1 mg nightly. Continue Ambien 10 mg nightly as needed. Return in 6 months.  Melony Overly, PA-C

## 2020-06-03 ENCOUNTER — Other Ambulatory Visit: Payer: Self-pay | Admitting: Physician Assistant

## 2020-07-03 DIAGNOSIS — J019 Acute sinusitis, unspecified: Secondary | ICD-10-CM | POA: Diagnosis not present

## 2020-07-03 DIAGNOSIS — R058 Other specified cough: Secondary | ICD-10-CM | POA: Diagnosis not present

## 2020-07-10 DIAGNOSIS — Z1152 Encounter for screening for COVID-19: Secondary | ICD-10-CM | POA: Diagnosis not present

## 2020-08-23 DIAGNOSIS — H40033 Anatomical narrow angle, bilateral: Secondary | ICD-10-CM | POA: Diagnosis not present

## 2020-08-23 DIAGNOSIS — H524 Presbyopia: Secondary | ICD-10-CM | POA: Diagnosis not present

## 2020-08-23 DIAGNOSIS — E119 Type 2 diabetes mellitus without complications: Secondary | ICD-10-CM | POA: Diagnosis not present

## 2020-08-26 DIAGNOSIS — N1831 Chronic kidney disease, stage 3a: Secondary | ICD-10-CM | POA: Diagnosis not present

## 2020-08-26 DIAGNOSIS — I129 Hypertensive chronic kidney disease with stage 1 through stage 4 chronic kidney disease, or unspecified chronic kidney disease: Secondary | ICD-10-CM | POA: Diagnosis not present

## 2020-08-26 DIAGNOSIS — J069 Acute upper respiratory infection, unspecified: Secondary | ICD-10-CM | POA: Diagnosis not present

## 2020-08-26 DIAGNOSIS — R0981 Nasal congestion: Secondary | ICD-10-CM | POA: Diagnosis not present

## 2020-11-12 ENCOUNTER — Ambulatory Visit
Admission: EM | Admit: 2020-11-12 | Discharge: 2020-11-12 | Disposition: A | Discharge status: Home / Self Care | Payer: Medicare Other | Attending: Urgent Care | Admitting: Urgent Care

## 2020-11-12 DIAGNOSIS — J988 Other specified respiratory disorders: Secondary | ICD-10-CM | POA: Diagnosis not present

## 2020-11-12 DIAGNOSIS — Z20822 Contact with and (suspected) exposure to covid-19: Secondary | ICD-10-CM

## 2020-11-12 DIAGNOSIS — B9789 Other viral agents as the cause of diseases classified elsewhere: Secondary | ICD-10-CM | POA: Diagnosis not present

## 2020-11-12 MED ORDER — PROMETHAZINE-DM 6.25-15 MG/5ML PO SYRP
5.0000 mL | ORAL_SOLUTION | Freq: Every evening | ORAL | 0 refills | Status: DC | PRN
Start: 2020-11-12 — End: 2021-06-11

## 2020-11-12 MED ORDER — FLUTICASONE PROPIONATE 50 MCG/ACT NA SUSP
2.0000 | Freq: Every day | NASAL | 0 refills | Status: AC
Start: 2020-11-12 — End: ?

## 2020-11-12 MED ORDER — BENZONATATE 100 MG PO CAPS: 100.0000 mg | ORAL_CAPSULE | Freq: Three times a day (TID) | ORAL | 0 refills | Status: DC | PRN

## 2020-11-12 MED ORDER — CETIRIZINE HCL 10 MG PO TABS
10.0000 mg | ORAL_TABLET | Freq: Every day | ORAL | 0 refills | Status: DC
Start: 2020-11-12 — End: 2021-06-11

## 2020-11-12 NOTE — ED Provider Notes (Signed)
[facility]   MRN: [id number] DOB: 1951-01-14  Subjective:   Timothy Hammond is a 70 y.o. male presenting for 2-day history of acute onset sinus congestion, mild cough, throat pain, postnasal drainage.  He is here for COVID-19 test.  Has his vaccination and booster.  Denies fever, body aches, chest pain, shortness of breath, rashes.  Denies history of arrhythmias, heart disease, MI.  No current facility-administered medications for this encounter.  Current Outpatient Medications:    ALPRAZolam (XANAX) 0.5 MG tablet, TAKE 1 TABLET BY MOUTH 4 TIMES DAILY AS NEEDED FOR ANXIETY, Disp: 120 tablet, Rfl: 5   buPROPion (WELLBUTRIN XL) 150 MG 24 hr tablet, Take 1 tablet (150 mg total) by mouth daily., Disp: 90 tablet, Rfl: 1   busPIRone (BUSPAR) 30 MG tablet, Take 1 tablet (30 mg total) by mouth 2 (two) times daily., Disp: 180 tablet, Rfl: 1   DULoxetine (CYMBALTA) 60 MG capsule, TAKE 2 CAPSULES BY MOUTH ONCE DAILY IN THE MORNING, Disp: 180 capsule, Rfl: 1   gabapentin (NEURONTIN) 300 MG capsule, Take 1 capsule (300 mg total) by mouth 2 (two) times daily with breakfast and lunch., Disp: 180 capsule, Rfl: 1   metFORMIN (GLUCOPHAGE) 500 MG tablet, Take 500 mg by mouth 2 (two) times daily with a meal., Disp: , Rfl:    NIFEdipine (PROCARDIA XL/ADALAT-CC) 90 MG 24 hr tablet, Take 90 mg by mouth daily., Disp: , Rfl:    risperiDONE (RISPERDAL) 1 MG tablet, Take 1 tablet (1 mg total) by mouth at bedtime., Disp: 90 tablet, Rfl: 1   zolpidem (AMBIEN) 10 MG tablet, TAKE 1 TABLET BY MOUTH AT BEDTIME AS NEEDED FOR SLEEP, Disp: 90 tablet, Rfl: 0   No Known Allergies  Past Medical History:  Diagnosis Date   Anxiety    Arthritis    Depression    Diabetes mellitus    Hyperlipidemia    Hypertension    Sleep apnea      Past Surgical History:  Procedure Laterality Date   broke arm     as child   KNEE ARTHROSCOPY     TOTAL KNEE ARTHROPLASTY Right 03/18/2015   Procedure: TOTAL KNEE  ARTHROPLASTY;  Surgeon: Marcene Corning, MD;  Location: [facility] OR;  Service: Orthopedics;  Laterality: Right;    Family History  Problem Relation Age of Onset   Deep vein thrombosis Mother     Social History   Tobacco Use   Smoking status: Never   Smokeless tobacco: Never  Substance Use Topics   Alcohol use: No   Drug use: Never    ROS   Objective:   Vitals: BP 126/84 (BP Location: Left Arm)   Pulse (!) 110   Temp 99 F (37.2 C) (Oral)   Resp 18   SpO2 95%   Pulse recheck was 106-108bpm  Physical Exam Constitutional:      General: He is not in acute distress.    Appearance: Normal appearance. He is well-developed. He is not ill-appearing, toxic-appearing or diaphoretic.  HENT:     Head: Normocephalic and atraumatic.     Right Ear: External ear normal.     Left Ear: External ear normal.     Nose: Nose normal.     Mouth/Throat:     Mouth: Mucous membranes are moist.     Pharynx: Oropharynx is clear.  Eyes:     General: No scleral icterus.    Extraocular Movements: Extraocular movements intact.     Pupils: Pupils are equal, round, and  reactive to light.  Cardiovascular:     Rate and Rhythm: Regular rhythm. Tachycardia present.     Heart sounds: Normal heart sounds. No murmur heard.   No friction rub. No gallop.  Pulmonary:     Effort: Pulmonary effort is normal. No respiratory distress.     Breath sounds: Normal breath sounds. No stridor. No wheezing, rhonchi or rales.  Neurological:     Mental Status: He is alert and oriented to person, place, and time.  Psychiatric:        Mood and Affect: Mood normal.        Behavior: Behavior normal.        Thought Content: Thought content normal.        Judgment: Judgment normal.    Assessment and Plan :   PDMP not reviewed this encounter.  1. Viral respiratory illness   2. Encounter for screening laboratory testing for COVID-19 virus     Will manage for viral illness such as viral URI, viral syndrome, viral  rhinitis, COVID-19. Counseled patient on nature of COVID-19 including modes of transmission, diagnostic testing, management and supportive care.  Offered scripts for symptomatic relief. COVID 19 testing is pending.  Suspect his borderline tachycardia is due to high suspicion for COVID-19.  Deferred EKG given lack of chest pain having a regular rhythm on exam.  Counseled patient on potential for adverse effects with medications prescribed/recommended today, ER and return-to-clinic precautions discussed, patient verbalized understanding.     Wallis Bamberg, PA-C 11/12/20 1110

## 2020-11-12 NOTE — ED Triage Notes (Signed)
Pt c/o nasal congestion, post nasal drip, sore throat, and slight cough x2 days. Requesting covid testing.

## 2020-11-12 NOTE — Discharge Instructions (Addendum)

## 2020-11-16 LAB — COVID-19, FLU A+B NAA

## 2020-11-17 ENCOUNTER — Ambulatory Visit
Admission: EM | Admit: 2020-11-17 | Discharge: 2020-11-17 | Disposition: A | Discharge status: Home / Self Care | Payer: Medicare Other | Attending: Physician Assistant | Admitting: Physician Assistant

## 2020-11-17 ENCOUNTER — Other Ambulatory Visit: Payer: Self-pay

## 2020-11-17 DIAGNOSIS — Z20822 Contact with and (suspected) exposure to covid-19: Secondary | ICD-10-CM | POA: Diagnosis not present

## 2020-11-17 NOTE — ED Triage Notes (Signed)
Pt here for reswab

## 2020-11-18 LAB — NOVEL CORONAVIRUS, NAA: SARS-CoV-2, NAA: DETECTED — AB

## 2020-11-18 LAB — SARS-COV-2, NAA 2 DAY TAT

## 2020-11-20 ENCOUNTER — Telehealth (INDEPENDENT_AMBULATORY_CARE_PROVIDER_SITE_OTHER): Payer: Medicare Other | Admitting: Physician Assistant

## 2020-11-20 ENCOUNTER — Encounter: Payer: Self-pay | Admitting: Physician Assistant

## 2020-11-20 DIAGNOSIS — F3341 Major depressive disorder, recurrent, in partial remission: Secondary | ICD-10-CM | POA: Diagnosis not present

## 2020-11-20 DIAGNOSIS — G4700 Insomnia, unspecified: Secondary | ICD-10-CM

## 2020-11-20 DIAGNOSIS — F411 Generalized anxiety disorder: Secondary | ICD-10-CM

## 2020-11-20 MED ORDER — RISPERIDONE 1 MG PO TABS: 1.0000 mg | ORAL_TABLET | Freq: Every day | ORAL | 1 refills | Status: DC

## 2020-11-20 MED ORDER — ALPRAZOLAM 0.5 MG PO TABS: ORAL_TABLET | ORAL | 5 refills | Status: DC

## 2020-11-20 MED ORDER — GABAPENTIN 300 MG PO CAPS: 300.0000 mg | ORAL_CAPSULE | Freq: Two times a day (BID) | ORAL | 1 refills | Status: DC

## 2020-11-20 MED ORDER — BUPROPION HCL ER (XL) 150 MG PO TB24: 150.0000 mg | ORAL_TABLET | Freq: Every day | ORAL | 1 refills | Status: DC

## 2020-11-20 MED ORDER — ZOLPIDEM TARTRATE 10 MG PO TABS: 10.0000 mg | ORAL_TABLET | Freq: Every evening | ORAL | 1 refills | Status: DC | PRN

## 2020-11-20 MED ORDER — BUSPIRONE HCL 30 MG PO TABS: 30.0000 mg | ORAL_TABLET | Freq: Two times a day (BID) | ORAL | 1 refills | Status: DC

## 2020-11-20 MED ORDER — DULOXETINE HCL 60 MG PO CPEP: ORAL_CAPSULE | ORAL | 1 refills | Status: DC

## 2020-11-20 NOTE — Progress Notes (Signed)
[facility]  Patient ID: Timothy Hammond,  MRN: [id number]  PCP: Rodrigo Ran, MD  Date of Evaluation: 11/20/2020 Time spent:30 minutes  Chief Complaint:  Chief Complaint   Anxiety; Depression; Insomnia; Follow-up      Virtual Visit via Telehealth  I connected with patient by telephone, with their informed consent, and verified patient privacy and that I am speaking with the correct person using two identifiers.  I am private, in my office and the patient is at home.  I discussed the limitations, risks, security and privacy concerns of performing an evaluation and management service by telephone and the availability of in person appointments. I also discussed with the patient that there may be a patient responsible charge related to this service. The patient expressed understanding and agreed to proceed.   I discussed the assessment and treatment plan with the patient. The patient was provided an opportunity to ask questions and all were answered. The patient agreed with the plan and demonstrated an understanding of the instructions.   The patient was advised to call back or seek an in-person evaluation if the symptoms worsen or if the condition fails to improve as anticipated.  I provided 30 minutes of non-face-to-face time during this encounter.  HISTORY/CURRENT STATUS: For routine med check.  Has covid right now.  He has not been terribly sick, only had a low-grade fever, fatigue, and cough.  He is getting over it without complications.  He is doing really well as far as his mental health goes.  The only problem is fear of going over bridges.  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  That fear went away for a while and then has recurred for no known reason.  Other than that, the anxiety is well controlled.  Not having panic attacks like he was in the generalized anxiety is responding well to the Xanax.  He is able to enjoy  things.  Energy and motivation are good.  Not isolating.  Work is going well.  He continues to drive, delivering auto parts.  Not crying easily.  Memory and focus are unchanged.  Personal hygiene is normal.  Not isolating no suicidal or homicidal thoughts.  Patient denies increased energy with decreased need for sleep, no increased talkativeness, no racing thoughts, no impulsivity or risky behaviors, no increased spending, no increased libido, no grandiosity, no increased irritability or anger, no paranoia, and no hallucinations.  Denies dizziness, syncope, seizures, numbness, tingling, tremor, tics, unsteady gait, slurred speech, confusion.  Denies muscle or joint pain, stiffness, or dystonia. Denies unexplained weight loss, frequent infections, or sores that heal slowly.  No polyphagia, polydipsia, or polyuria. Denies visual changes or paresthesias.   Individual Medical History/ Review of Systems: Changes? :Yes  has covid.   Past medications for mental health diagnoses include: Celexa, Xanax, Prozac, Lunesta, Ambien, Paxil, Wellbutrin, Cymbalta, Ativan, BuSpar, Gabapentin, Klonopin  Allergies: Patient has no known allergies.  Current Medications:  Current Outpatient Medications:    benzonatate (TESSALON) 100 MG capsule, Take 1-2 capsules (100-200 mg total) by mouth 3 (three) times daily as needed for cough., Disp: 60 capsule, Rfl: 0   cetirizine (ZYRTEC ALLERGY) 10 MG tablet, Take 1 tablet (10 mg total) by mouth daily., Disp: 30 tablet, Rfl: 0   fluticasone (FLONASE) 50 MCG/ACT nasal spray, Place 2 sprays into both nostrils daily., Disp: 16 g, Rfl: 0   metFORMIN (GLUCOPHAGE) 500 MG tablet, Take 500  mg by mouth 2 (two) times daily with a meal., Disp: , Rfl:    NIFEdipine (PROCARDIA XL/ADALAT-CC) 90 MG 24 hr tablet, Take 90 mg by mouth daily., Disp: , Rfl:    promethazine-dextromethorphan (PROMETHAZINE-DM) 6.25-15 MG/5ML syrup, Take 5 mLs by mouth at bedtime as needed for cough., Disp: 100 mL,  Rfl: 0   ALPRAZolam (XANAX) 0.5 MG tablet, TAKE 1 TABLET BY MOUTH 4 TIMES DAILY AS NEEDED FOR ANXIETY, Disp: 120 tablet, Rfl: 5   buPROPion (WELLBUTRIN XL) 150 MG 24 hr tablet, Take 1 tablet (150 mg total) by mouth daily., Disp: 90 tablet, Rfl: 1   busPIRone (BUSPAR) 30 MG tablet, Take 1 tablet (30 mg total) by mouth 2 (two) times daily., Disp: 180 tablet, Rfl: 1   DULoxetine (CYMBALTA) 60 MG capsule, TAKE 2 CAPSULES BY MOUTH ONCE DAILY IN THE MORNING, Disp: 180 capsule, Rfl: 1   gabapentin (NEURONTIN) 300 MG capsule, Take 1 capsule (300 mg total) by mouth 2 (two) times daily with breakfast and lunch., Disp: 180 capsule, Rfl: 1   risperiDONE (RISPERDAL) 1 MG tablet, Take 1 tablet (1 mg total) by mouth at bedtime., Disp: 90 tablet, Rfl: 1   zolpidem (AMBIEN) 10 MG tablet, Take 1 tablet (10 mg total) by mouth at bedtime as needed. for sleep, Disp: 90 tablet, Rfl: 1 Medication Side Effects: none  Family Medical/ Social History: Changes?  No  MENTAL HEALTH EXAM:  There were no vitals taken for this visit.There is no height or weight on file to calculate BMI.  General Appearance:  Unable to assess  Eye Contact:   Unable to assess  Speech:  Clear and Coherent  Volume:  Normal  Mood:  Euthymic  Affect:   Unable to assess  Thought Process:  Goal Directed and Descriptions of Associations: Intact  Orientation:  Full (Time, Place, and Person)  Thought Content: Logical   Suicidal Thoughts:  No  Homicidal Thoughts:  No  Memory:  WNL  Judgement:  Intact  Insight:  Good  Psychomotor Activity:   Unable to assess  Concentration:  Concentration: Good and Attention Span: Good  Recall:  Good  Fund of Knowledge: Good  Language: Good  Assets:  Desire for Improvement  ADL's:  Intact  Cognition: WNL  Prognosis:  Good    DIAGNOSES:    ICD-10-CM   1. Generalized anxiety disorder  F41.1     2. Recurrent major depressive disorder, in partial remission (HCC)  F33.41     3. Insomnia, unspecified type   G47.00        Receiving Psychotherapy: No    RECOMMENDATIONS: PDMP was reviewed. Last Ambien and Xanax 09/10/2020.  I provided 30 minutes of non-face-to-face time during this encounter, including time spent before and after the visit in records review, medical decision making, and charting.  I am glad to hear that he is doing so well!  No changes in meds are necessary. He has an appointment next week with his PCP.  He is due for labs and I have asked him to have his blood sugar and cholesterol checked and the results sent to me. Continue gabapentin 300 mg every morning and may take 1 at lunch if needed or even routinely. Continue Wellbutrin XL 150 mg, 1 p.o. every morning. Continue BuSpar to 30 mg p.o. twice daily. Continue Xanax 0.5 mg, 1 p.o. 4 times daily as needed.  Continue Cymbalta 60 mg, 2 p.o. daily. Continue Risperdal 1 mg nightly. Continue Ambien 10 mg nightly as needed.  Return in 6 months.  Melony Overly, PA-C

## 2020-11-24 DIAGNOSIS — I129 Hypertensive chronic kidney disease with stage 1 through stage 4 chronic kidney disease, or unspecified chronic kidney disease: Secondary | ICD-10-CM | POA: Diagnosis not present

## 2020-11-24 DIAGNOSIS — E785 Hyperlipidemia, unspecified: Secondary | ICD-10-CM | POA: Diagnosis not present

## 2020-11-24 DIAGNOSIS — E1129 Type 2 diabetes mellitus with other diabetic kidney complication: Secondary | ICD-10-CM | POA: Diagnosis not present

## 2020-11-24 DIAGNOSIS — N1831 Chronic kidney disease, stage 3a: Secondary | ICD-10-CM | POA: Diagnosis not present

## 2020-12-01 ENCOUNTER — Telehealth: Payer: Self-pay | Admitting: Physician Assistant

## 2020-12-01 NOTE — Telephone Encounter (Signed)
Spoke to pharmacy.There was a error somewhere and they have to call his insurance to get it straightened out.

## 2020-12-01 NOTE — Telephone Encounter (Signed)
Timothy Hammond called to report that he went to the pharmacy to pick up some of his medication this weekend and they told him they didn't have prescriptions for any medication.  Several were sent in 11/20/20, but they told him they didn't have them.  Please resend.

## 2020-12-01 NOTE — Telephone Encounter (Signed)
Spoke to pharmacy and everything is fine now pt notified meds are ready

## 2021-03-05 DIAGNOSIS — N1831 Chronic kidney disease, stage 3a: Secondary | ICD-10-CM | POA: Diagnosis not present

## 2021-03-05 DIAGNOSIS — E1129 Type 2 diabetes mellitus with other diabetic kidney complication: Secondary | ICD-10-CM | POA: Diagnosis not present

## 2021-03-05 DIAGNOSIS — I129 Hypertensive chronic kidney disease with stage 1 through stage 4 chronic kidney disease, or unspecified chronic kidney disease: Secondary | ICD-10-CM | POA: Diagnosis not present

## 2021-04-28 DIAGNOSIS — G4733 Obstructive sleep apnea (adult) (pediatric): Secondary | ICD-10-CM | POA: Diagnosis not present

## 2021-05-05 ENCOUNTER — Other Ambulatory Visit: Payer: Self-pay | Admitting: Physician Assistant

## 2021-05-05 NOTE — Telephone Encounter (Signed)
Please schedule appt

## 2021-05-06 NOTE — Telephone Encounter (Signed)
Pt has an appt 06/02/21

## 2021-05-19 ENCOUNTER — Other Ambulatory Visit: Payer: Self-pay | Admitting: Physician Assistant

## 2021-05-21 ENCOUNTER — Other Ambulatory Visit: Payer: Self-pay | Admitting: Physician Assistant

## 2021-06-02 ENCOUNTER — Ambulatory Visit: Payer: Medicare Other | Admitting: Physician Assistant

## 2021-06-11 ENCOUNTER — Ambulatory Visit: Payer: Medicare Other | Admitting: Physician Assistant

## 2021-06-11 ENCOUNTER — Encounter: Payer: Self-pay | Admitting: Physician Assistant

## 2021-06-11 ENCOUNTER — Other Ambulatory Visit: Payer: Self-pay

## 2021-06-11 DIAGNOSIS — G4700 Insomnia, unspecified: Secondary | ICD-10-CM

## 2021-06-11 DIAGNOSIS — F3341 Major depressive disorder, recurrent, in partial remission: Secondary | ICD-10-CM | POA: Diagnosis not present

## 2021-06-11 DIAGNOSIS — F411 Generalized anxiety disorder: Secondary | ICD-10-CM | POA: Diagnosis not present

## 2021-06-11 MED ORDER — ZOLPIDEM TARTRATE 10 MG PO TABS: 10.0000 mg | ORAL_TABLET | Freq: Every evening | ORAL | 1 refills | Status: DC | PRN

## 2021-06-11 MED ORDER — ALPRAZOLAM 0.5 MG PO TABS: 0.5000 mg | ORAL_TABLET | Freq: Four times a day (QID) | ORAL | 5 refills | Status: DC | PRN

## 2021-06-11 MED ORDER — BUPROPION HCL ER (XL) 150 MG PO TB24: 150.0000 mg | ORAL_TABLET | Freq: Every day | ORAL | 1 refills | Status: DC

## 2021-06-11 MED ORDER — BUSPIRONE HCL 30 MG PO TABS: 30.0000 mg | ORAL_TABLET | Freq: Two times a day (BID) | ORAL | 1 refills | Status: DC

## 2021-06-11 MED ORDER — GABAPENTIN 300 MG PO CAPS: 300.0000 mg | ORAL_CAPSULE | Freq: Two times a day (BID) | ORAL | 1 refills | Status: DC

## 2021-06-11 NOTE — Progress Notes (Signed)
[facility]  Patient ID: Timothy Hammond,  MRN: [id number]  PCP: Rodrigo Ran, MD  Date of Evaluation: 06/11/2021  time spent:30 minutes  Chief Complaint:  Chief Complaint   Anxiety; Depression; Insomnia; Follow-up      HISTORY/CURRENT STATUS: For routine med check.  He is doing really well.  Still has anxiety, especially with driving, through a tunnel or over a bridge but it is much better than it used to be.  The Xanax is very helpful.  It does not make him sleepy.  He is not having panic attacks, more of a sense of unease with the anxiety.  It just gets worse if he does not have the Xanax.  The Risperdal and gabapentin have really helped prevent the anxiety, not just taking the Xanax as a rescue medication.  Patient denies loss of interest in usual activities and is able to enjoy things.  Because of the time of year, he has not been able to get out and play golf like he usually does.  He does not like to sit around in the house so that has been a little hard.  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  Denies decreased energy or motivation.  Appetite has not changed.  No extreme sadness, tearfulness, or feelings of hopelessness.  Denies any changes in concentration, making decisions or remembering things. He is sleeping well with the Ambien.  Denies suicidal or homicidal thoughts.  He has an appointment in a month or so to have an annual physical with lab work.  He sees someone at [facility].  Patient denies increased energy with decreased need for sleep, no increased talkativeness, no racing thoughts, no impulsivity or risky behaviors, no increased spending, no increased libido, no grandiosity, no increased irritability or anger, no paranoia, and no  hallucinations.  Denies dizziness, syncope, seizures, numbness, tingling, tremor, tics, unsteady gait, slurred speech, confusion.  Denies muscle or joint pain, stiffness, or dystonia. Denies unexplained weight loss, frequent infections, or sores that heal slowly.  No polyphagia, polydipsia, or polyuria. Denies visual changes or paresthesias.   Individual Medical History/ Review of Systems: Changes? :Yes  has covid.   Past medications for mental health diagnoses include: Celexa, Xanax, Prozac, Lunesta, Ambien, Paxil, Wellbutrin, Cymbalta, Ativan, BuSpar, Gabapentin, Klonopin  Allergies: Patient has no known allergies.  Current Medications:  Current Outpatient Medications:    DULoxetine (CYMBALTA) 60 MG capsule, TAKE 2 CAPSULES BY MOUTH ONCE DAILY IN THE MORNING, Disp: 180 capsule, Rfl: 0   fluticasone (FLONASE) 50 MCG/ACT nasal spray, Place 2 sprays into both nostrils daily., Disp: 16 g, Rfl: 0   metFORMIN (GLUCOPHAGE) 500 MG tablet, Take 500 mg by mouth 2 (two) times daily with a meal., Disp: , Rfl:    NIFEdipine (PROCARDIA XL/ADALAT-CC) 90 MG 24 hr tablet, Take 90 mg by mouth daily., Disp: , Rfl:    risperiDONE (RISPERDAL) 1 MG tablet, TAKE 1 TABLET BY MOUTH AT BEDTIME, Disp: 90 tablet, Rfl: 0   ALPRAZolam (XANAX) 0.5 MG tablet, Take 1 tablet (0.5 mg total) by mouth 4 (four) times daily as needed for anxiety., Disp: 120 tablet, Rfl: 5   buPROPion (WELLBUTRIN XL) 150 MG 24 hr tablet, Take 1 tablet (150 mg total) by mouth daily., Disp: 90 tablet, Rfl: 1  busPIRone (BUSPAR) 30 MG tablet, Take 1 tablet (30 mg total) by mouth 2 (two) times daily., Disp: 180 tablet, Rfl: 1   gabapentin (NEURONTIN) 300 MG capsule, Take 1 capsule (300 mg total) by mouth 2 (two) times daily with breakfast and lunch., Disp: 180 capsule, Rfl: 1   zolpidem (AMBIEN) 10 MG tablet, Take 1 tablet (10 mg total) by mouth at bedtime as needed. for sleep, Disp: 90 tablet, Rfl: 1 Medication Side Effects: none  Family Medical/  Social History: Changes?  No  MENTAL HEALTH EXAM:  There were no vitals taken for this visit.There is no height or weight on file to calculate BMI.  General Appearance: Casual, Well Groomed, and Obese  Eye Contact:  Good  Speech:  Clear and Coherent  Volume:  Normal  Mood:  Euthymic  Affect:  Congruent  Thought Process:  Goal Directed and Descriptions of Associations: Intact  Orientation:  Full (Time, Place, and Person)  Thought Content: Logical   Suicidal Thoughts:  No  Homicidal Thoughts:  No  Memory:  WNL  Judgement:  Intact  Insight:  Good  Psychomotor Activity:  Normal  Concentration:  Concentration: Good and Attention Span: Good  Recall:  Good  Fund of Knowledge: Good  Language: Good  Assets:  Desire for Improvement  ADL's:  Intact  Cognition: WNL  Prognosis:  Good   CMP 03/05/2021 on chart under Care Everywhere, [facility].  Glucose was 207, random. Last lipid panel was January 2022.  DIAGNOSES:    ICD-10-CM   1. Generalized anxiety disorder  F41.1     2. Recurrent major depressive disorder, in partial remission (HCC)  F33.41     3. Insomnia, unspecified type  G47.00         Receiving Psychotherapy: No    RECOMMENDATIONS: PDMP was reviewed. Last Ambien 03/10/2021 and Xanax 05/22/2021. I provided 30 minutes of face to face time during this encounter, including time spent before and after the visit in records review, medical decision making, counseling pertinent to today's visit, and charting.  I am glad to see him doing so well.  No changes in medications are noted. He will ask his provider to send me his labs when drawn in a few weeks. Continue gabapentin 300 mg every morning and may take 1 at lunch if needed or even routinely. Continue Wellbutrin XL 150 mg, 1 p.o. every morning. Continue BuSpar to 30 mg p.o. twice daily. Continue Xanax 0.5 mg, 1 p.o. 4 times daily as needed.  Continue Cymbalta 60 mg, 2 p.o. daily. Continue Risperdal 1 mg  nightly. Continue Ambien 10 mg nightly as needed. Return in 6 months.  Melony Overly, PA-C

## 2021-06-22 DIAGNOSIS — M25561 Pain in right knee: Secondary | ICD-10-CM | POA: Diagnosis not present

## 2021-07-01 DIAGNOSIS — E785 Hyperlipidemia, unspecified: Secondary | ICD-10-CM | POA: Diagnosis not present

## 2021-07-01 DIAGNOSIS — E1129 Type 2 diabetes mellitus with other diabetic kidney complication: Secondary | ICD-10-CM | POA: Diagnosis not present

## 2021-07-01 DIAGNOSIS — F329 Major depressive disorder, single episode, unspecified: Secondary | ICD-10-CM | POA: Diagnosis not present

## 2021-07-01 DIAGNOSIS — Z0000 Encounter for general adult medical examination without abnormal findings: Secondary | ICD-10-CM | POA: Diagnosis not present

## 2021-07-07 DIAGNOSIS — E1129 Type 2 diabetes mellitus with other diabetic kidney complication: Secondary | ICD-10-CM | POA: Diagnosis not present

## 2021-07-07 DIAGNOSIS — I10 Essential (primary) hypertension: Secondary | ICD-10-CM | POA: Diagnosis not present

## 2021-07-07 DIAGNOSIS — Z125 Encounter for screening for malignant neoplasm of prostate: Secondary | ICD-10-CM | POA: Diagnosis not present

## 2021-07-07 DIAGNOSIS — E785 Hyperlipidemia, unspecified: Secondary | ICD-10-CM | POA: Diagnosis not present

## 2021-07-29 DIAGNOSIS — G4733 Obstructive sleep apnea (adult) (pediatric): Secondary | ICD-10-CM | POA: Diagnosis not present

## 2021-07-30 ENCOUNTER — Other Ambulatory Visit: Payer: Self-pay | Admitting: Physician Assistant

## 2021-08-24 ENCOUNTER — Other Ambulatory Visit: Payer: Self-pay | Admitting: Physician Assistant

## 2021-09-16 DIAGNOSIS — J0100 Acute maxillary sinusitis, unspecified: Secondary | ICD-10-CM | POA: Diagnosis not present

## 2021-09-16 DIAGNOSIS — J309 Allergic rhinitis, unspecified: Secondary | ICD-10-CM | POA: Diagnosis not present

## 2021-09-16 DIAGNOSIS — R0981 Nasal congestion: Secondary | ICD-10-CM | POA: Diagnosis not present

## 2021-09-16 DIAGNOSIS — I129 Hypertensive chronic kidney disease with stage 1 through stage 4 chronic kidney disease, or unspecified chronic kidney disease: Secondary | ICD-10-CM | POA: Diagnosis not present

## 2021-09-28 ENCOUNTER — Other Ambulatory Visit: Payer: Self-pay | Admitting: Physician Assistant

## 2021-10-06 ENCOUNTER — Other Ambulatory Visit: Payer: Self-pay | Admitting: Physician Assistant

## 2021-10-15 DIAGNOSIS — J342 Deviated nasal septum: Secondary | ICD-10-CM | POA: Diagnosis not present

## 2021-10-15 DIAGNOSIS — R519 Headache, unspecified: Secondary | ICD-10-CM | POA: Diagnosis not present

## 2021-10-15 DIAGNOSIS — J329 Chronic sinusitis, unspecified: Secondary | ICD-10-CM | POA: Diagnosis not present

## 2021-10-22 ENCOUNTER — Other Ambulatory Visit: Payer: Self-pay | Admitting: Physician Assistant

## 2021-11-02 DIAGNOSIS — R519 Headache, unspecified: Secondary | ICD-10-CM | POA: Diagnosis not present

## 2021-11-02 DIAGNOSIS — J329 Chronic sinusitis, unspecified: Secondary | ICD-10-CM | POA: Diagnosis not present

## 2021-11-05 DIAGNOSIS — R519 Headache, unspecified: Secondary | ICD-10-CM | POA: Diagnosis not present

## 2021-11-05 DIAGNOSIS — J329 Chronic sinusitis, unspecified: Secondary | ICD-10-CM | POA: Diagnosis not present

## 2021-11-05 DIAGNOSIS — J342 Deviated nasal septum: Secondary | ICD-10-CM | POA: Diagnosis not present

## 2021-12-07 ENCOUNTER — Other Ambulatory Visit: Payer: Self-pay | Admitting: Physician Assistant

## 2021-12-08 NOTE — Telephone Encounter (Signed)
Filled 09/09/21

## 2021-12-10 ENCOUNTER — Encounter: Payer: Self-pay | Admitting: Physician Assistant

## 2021-12-10 ENCOUNTER — Ambulatory Visit: Payer: Medicare Other | Admitting: Physician Assistant

## 2021-12-10 DIAGNOSIS — F3341 Major depressive disorder, recurrent, in partial remission: Secondary | ICD-10-CM

## 2021-12-10 DIAGNOSIS — G4700 Insomnia, unspecified: Secondary | ICD-10-CM | POA: Diagnosis not present

## 2021-12-10 DIAGNOSIS — F411 Generalized anxiety disorder: Secondary | ICD-10-CM | POA: Diagnosis not present

## 2021-12-10 MED ORDER — RISPERIDONE 1 MG PO TABS: 1.0000 mg | ORAL_TABLET | Freq: Every day | ORAL | 1 refills | Status: DC

## 2021-12-10 MED ORDER — BUSPIRONE HCL 30 MG PO TABS: 30.0000 mg | ORAL_TABLET | Freq: Two times a day (BID) | ORAL | 1 refills | Status: DC

## 2021-12-10 MED ORDER — GABAPENTIN 300 MG PO CAPS: 300.0000 mg | ORAL_CAPSULE | Freq: Two times a day (BID) | ORAL | 1 refills | Status: DC

## 2021-12-10 MED ORDER — BUPROPION HCL ER (XL) 150 MG PO TB24
150.0000 mg | ORAL_TABLET | Freq: Every day | ORAL | 1 refills | Status: DC
Start: 2021-12-10 — End: 2022-06-16

## 2021-12-10 MED ORDER — DULOXETINE HCL 60 MG PO CPEP: 120.0000 mg | ORAL_CAPSULE | Freq: Every day | ORAL | 1 refills | Status: DC

## 2021-12-10 MED ORDER — ALPRAZOLAM 0.5 MG PO TABS: 0.5000 mg | ORAL_TABLET | Freq: Four times a day (QID) | ORAL | 5 refills | Status: DC | PRN

## 2021-12-10 NOTE — Progress Notes (Signed)
[facility]  Patient ID: Timothy Hammond,  MRN: [id number]  PCP: Rodrigo Ran, MD  Date of Evaluation: 12/10/2021  time spent:30 minutes  Chief Complaint:  Chief Complaint   Anxiety; Depression; Insomnia; Follow-up    HISTORY/CURRENT STATUS: For routine med check.  Doing really well. "I don't have any complaints."  Anxiety is well controlled.  He does need the Xanax daily, if he does not take it he feels a generalized sense of unease like something bad is going to happen at any time.  Not having panic attacks.  He sleeps well with the Ambien.  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  Patient is able to enjoy things.  Has been playing more golf lately.  Energy and motivation are good.  No extreme sadness, tearfulness, or feelings of hopelessness.  ADLs and personal hygiene are normal.   Denies any changes in concentration, making decisions, or remembering things.  Appetite has not changed.  Weight is stable. Denies suicidal or homicidal thoughts.  Patient denies increased energy with decreased need for sleep, increased talkativeness, racing thoughts, impulsivity or risky behaviors, increased spending, increased libido, grandiosity, increased irritability or anger, paranoia, and no hallucinations.  Denies dizziness, syncope, seizures, numbness, tingling, tremor, tics, unsteady gait, slurred speech, confusion.  Denies muscle or joint pain, stiffness, or dystonia. Denies unexplained weight loss, frequent infections, or sores that heal slowly.  No polyphagia, polydipsia, or polyuria. Denies visual changes or paresthesias.   Individual Medical History/ Review of Systems: Changes? :Yes   will see ENT next week, for a 'closed up sinus'  Past medications for mental health diagnoses include: Celexa, Xanax, Prozac, Lunesta, Ambien, Paxil, Wellbutrin, Cymbalta, Ativan, BuSpar, Gabapentin, Klonopin  Allergies: Patient has no known allergies.  Current  Medications:  Current Outpatient Medications:    metFORMIN (GLUCOPHAGE) 500 MG tablet, Take 500 mg by mouth 2 (two) times daily with a meal., Disp: , Rfl:    NIFEdipine (PROCARDIA XL/ADALAT-CC) 90 MG 24 hr tablet, Take 90 mg by mouth daily., Disp: , Rfl:    zolpidem (AMBIEN) 10 MG tablet, TAKE 1 TABLET BY MOUTH AT BEDTIME AS NEEDED FOR SLEEP, Disp: 90 tablet, Rfl: 0   ALPRAZolam (XANAX) 0.5 MG tablet, Take 1 tablet (0.5 mg total) by mouth 4 (four) times daily as needed for anxiety., Disp: 120 tablet, Rfl: 5   buPROPion (WELLBUTRIN XL) 150 MG 24 hr tablet, Take 1 tablet (150 mg total) by mouth daily., Disp: 90 tablet, Rfl: 1   busPIRone (BUSPAR) 30 MG tablet, Take 1 tablet (30 mg total) by mouth 2 (two) times daily., Disp: 180 tablet, Rfl: 1   DULoxetine (CYMBALTA) 60 MG capsule, Take 2 capsules (120 mg total) by mouth daily., Disp: 180 capsule, Rfl: 1   fluticasone (FLONASE) 50 MCG/ACT nasal spray, Place 2 sprays into both nostrils daily. (Patient not taking: Reported on 12/10/2021), Disp: 16 g, Rfl: 0   gabapentin (NEURONTIN) 300 MG capsule, Take 1 capsule (300 mg total) by mouth 2 (two) times daily with breakfast and lunch., Disp: 180 capsule, Rfl: 1   risperiDONE (RISPERDAL) 1 MG tablet, Take 1 tablet (1 mg total) by mouth at bedtime., Disp: 90 tablet, Rfl: 1 Medication Side Effects: none  Family Medical/ Social History: Changes?  No  MENTAL HEALTH EXAM:  There were no vitals taken for this visit.There is no height or weight on file to calculate BMI.  General Appearance: Casual, Well Groomed, and Obese  Eye Contact:  Good  Speech:  Clear and Coherent  Volume:  Normal  Mood:  Euthymic  Affect:  Congruent  Thought Process:  Goal Directed and Descriptions of Associations: Intact  Orientation:  Full (Time, Place, and Person)  Thought Content: Logical   Suicidal Thoughts:  No  Homicidal Thoughts:  No  Memory:  WNL  Judgement:  Intact  Insight:  Good  Psychomotor Activity:  Normal   Concentration:  Concentration: Good and Attention Span: Good  Recall:  Good  Fund of Knowledge: Good  Language: Good  Assets:  Desire for Improvement  ADL's:  Intact  Cognition: WNL  Prognosis:  Good   Had labs at [facility] within the past 6 months.  I do not have those results but he states blood sugar and cholesterol were good.  DIAGNOSES:    ICD-10-CM   1. Recurrent major depressive disorder, in partial remission (HCC)  F33.41     2. Generalized anxiety disorder  F41.1     3. Insomnia, unspecified type  G47.00       Receiving Psychotherapy: No    RECOMMENDATIONS: PDMP was reviewed. Last Ambien 12/08/2021.  Xanax filled 11/04/2021.   I provided 20 minutes of face to face time during this encounter, including time spent before and after the visit in records review, medical decision making, counseling pertinent to today's visit, and charting.   I am glad to see him doing well.  No changes need to be made.  Continue Xanax 0.5 mg, 1 p.o. 4 times daily as needed.  Continue Wellbutrin XL 150 mg, 1 p.o. every morning. Continue BuSpar to 30 mg p.o. twice daily. Continue Cymbalta 60 mg, 2 p.o. daily. Continue Risperdal 1 mg nightly. Continue Ambien 10 mg nightly as needed. Return in 6 months.  Melony Overly, PA-C

## 2021-12-16 DIAGNOSIS — M25562 Pain in left knee: Secondary | ICD-10-CM | POA: Diagnosis not present

## 2021-12-17 DIAGNOSIS — J329 Chronic sinusitis, unspecified: Secondary | ICD-10-CM | POA: Diagnosis not present

## 2021-12-17 DIAGNOSIS — J342 Deviated nasal septum: Secondary | ICD-10-CM | POA: Diagnosis not present

## 2022-01-13 DIAGNOSIS — M25562 Pain in left knee: Secondary | ICD-10-CM | POA: Diagnosis not present

## 2022-03-06 ENCOUNTER — Other Ambulatory Visit: Payer: Self-pay | Admitting: Physician Assistant

## 2022-03-08 NOTE — Telephone Encounter (Signed)
Due 1/19

## 2022-05-11 DIAGNOSIS — K080 Exfoliation of teeth due to systemic causes: Secondary | ICD-10-CM | POA: Diagnosis not present

## 2022-05-19 DIAGNOSIS — K080 Exfoliation of teeth due to systemic causes: Secondary | ICD-10-CM | POA: Diagnosis not present

## 2022-06-03 DIAGNOSIS — K080 Exfoliation of teeth due to systemic causes: Secondary | ICD-10-CM | POA: Diagnosis not present

## 2022-06-04 ENCOUNTER — Other Ambulatory Visit: Payer: Self-pay | Admitting: Physician Assistant

## 2022-06-04 NOTE — Telephone Encounter (Signed)
Filled 11/7

## 2022-06-06 ENCOUNTER — Other Ambulatory Visit: Payer: Self-pay | Admitting: Physician Assistant

## 2022-06-16 ENCOUNTER — Encounter: Payer: Self-pay | Admitting: Physician Assistant

## 2022-06-16 ENCOUNTER — Ambulatory Visit (INDEPENDENT_AMBULATORY_CARE_PROVIDER_SITE_OTHER): Payer: Medicare Other | Admitting: Physician Assistant

## 2022-06-16 DIAGNOSIS — F411 Generalized anxiety disorder: Secondary | ICD-10-CM

## 2022-06-16 DIAGNOSIS — F418 Other specified anxiety disorders: Secondary | ICD-10-CM | POA: Diagnosis not present

## 2022-06-16 DIAGNOSIS — G4700 Insomnia, unspecified: Secondary | ICD-10-CM

## 2022-06-16 DIAGNOSIS — F3341 Major depressive disorder, recurrent, in partial remission: Secondary | ICD-10-CM

## 2022-06-16 IMAGING — CT CT CARDIAC CORONARY ARTERY CALCIUM SCORE
3 series · 14 of 20 positions shown, 16 images · non-contrast
Comparison: Chest radiograph 03/10/2015

CLINICAL DATA: Elevated cholesterol. Nonsmoker. Chest radiograph
03/10/2015

EXAM:
CT CARDIAC CORONARY ARTERY CALCIUM SCORE
TECHNIQUE: Non-contrast imaging through the heart was performed using
prospective ECG gating. Image post processing was performed on an
independent workstation, allowing for quantitative analysis of the
heart and coronary arteries. Note that this exam targets the heart
and the chest was not imaged in its entirety.

[Series 2: calcium scoring 2.00 qr36 bestdiast 71% hrt calciu · axial · 0.45mm/px · z∈[+1594,+1690]mm · 4 of 80 slices shown]
[im 16/80  vessel]
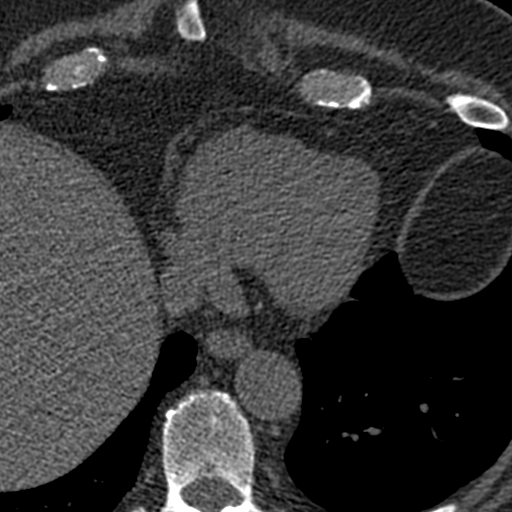
[im 32/80  vessel]
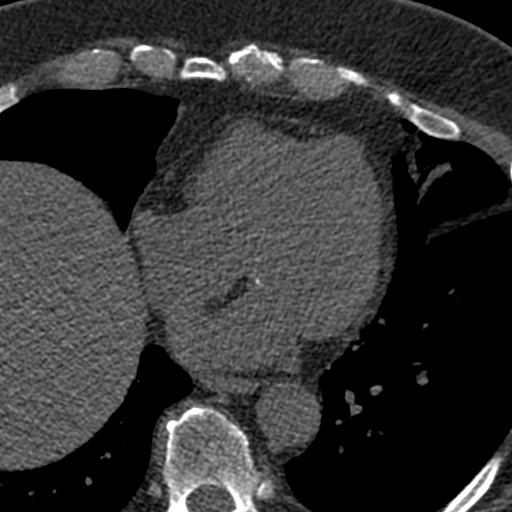
[im 48/80  vessel]
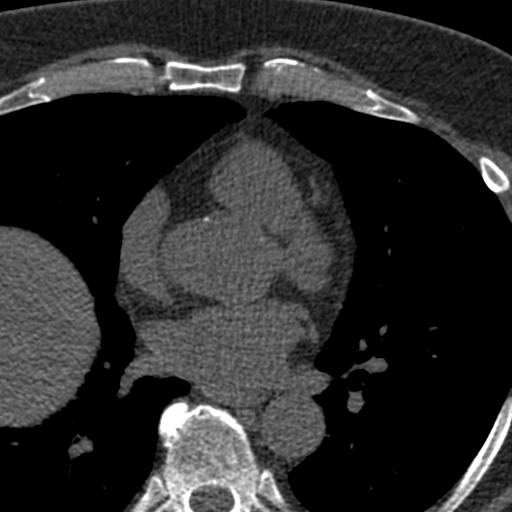
[im 64/80  vessel]
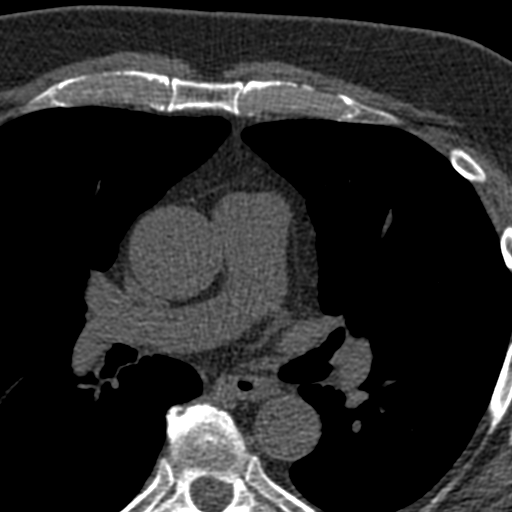

[Series 3: calcium scoring 2.00 br40 bestdiast 71% axial · axial · 0.65mm/px · z∈[+1590,+1694]mm · 5 of 80 slices shown, 7 images]
[im 14/80  vessel]
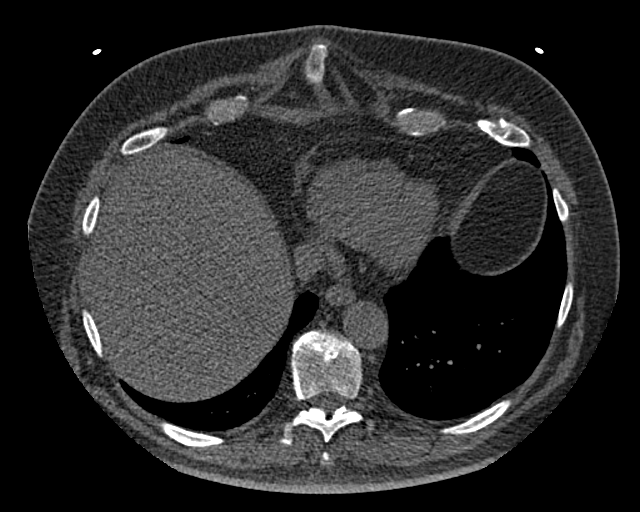
[im 14/80  lung]
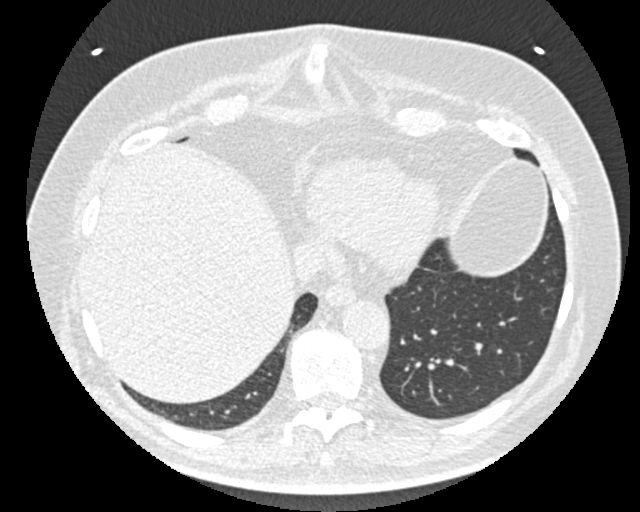
[im 27/80  vessel]
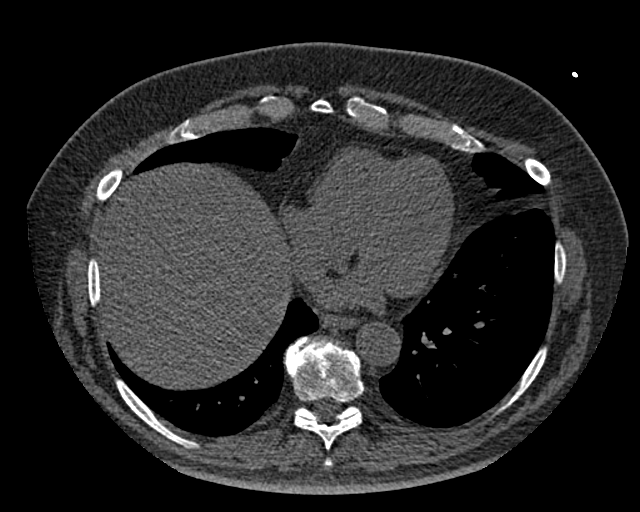
[im 40/80  vessel]
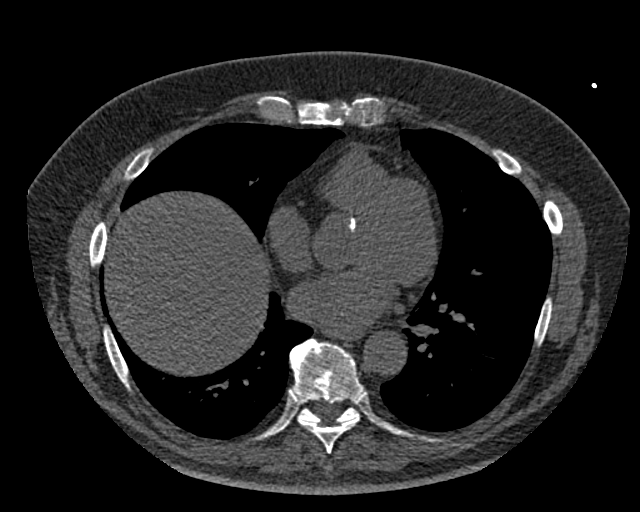
[im 53/80  vessel]
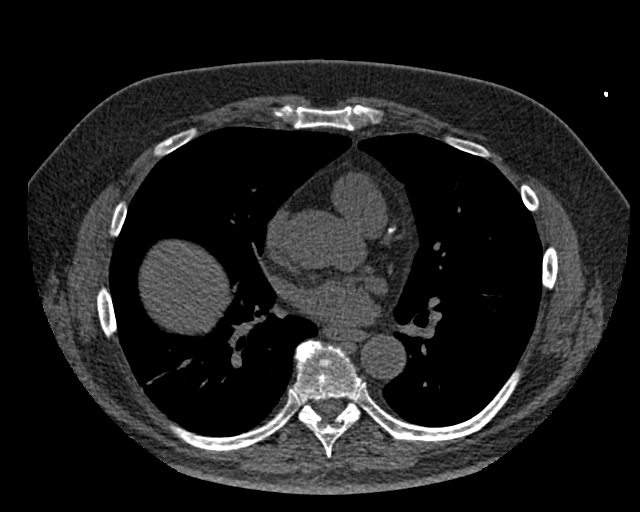
[im 66/80  vessel]
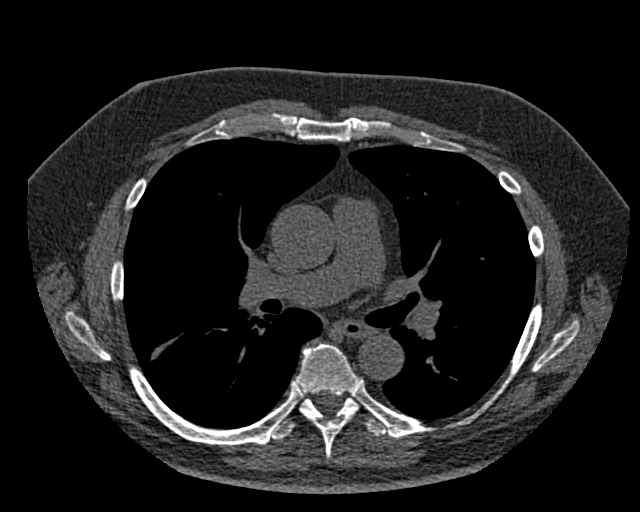
[im 66/80  lung]
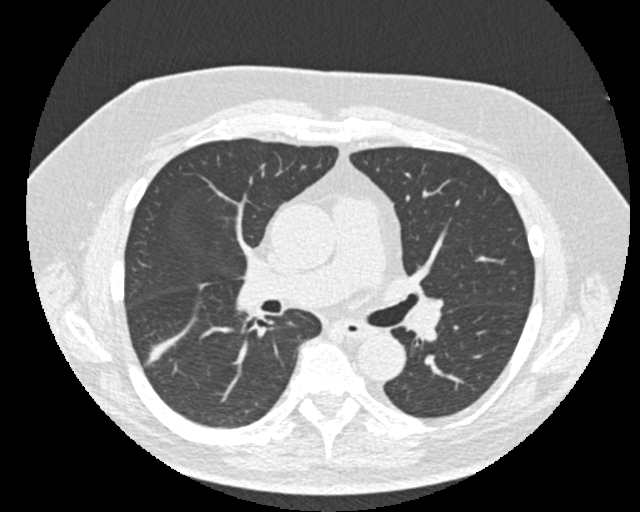

[Series 9: calcium scoring 2.00 br60 bestdiast 71% lungs · axial · 0.65mm/px · z∈[+1590,+1694]mm · 5 of 80 slices shown]
[im 14/80  vessel]
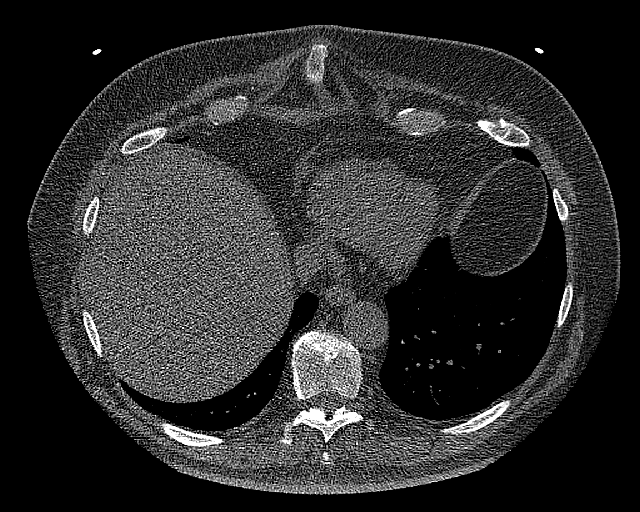
[im 27/80  vessel]
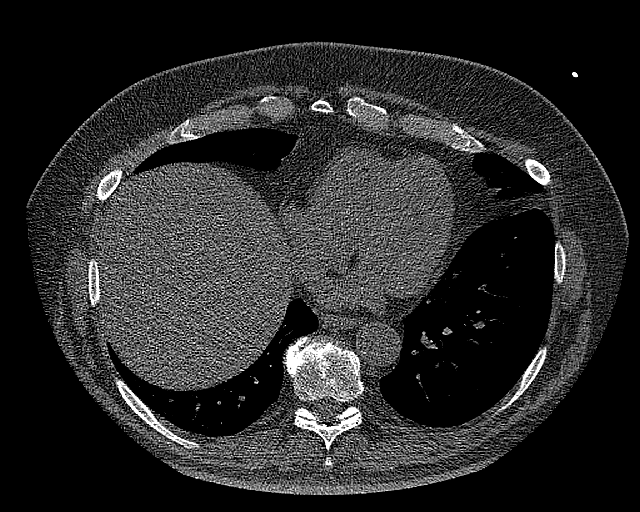
[im 40/80  vessel]
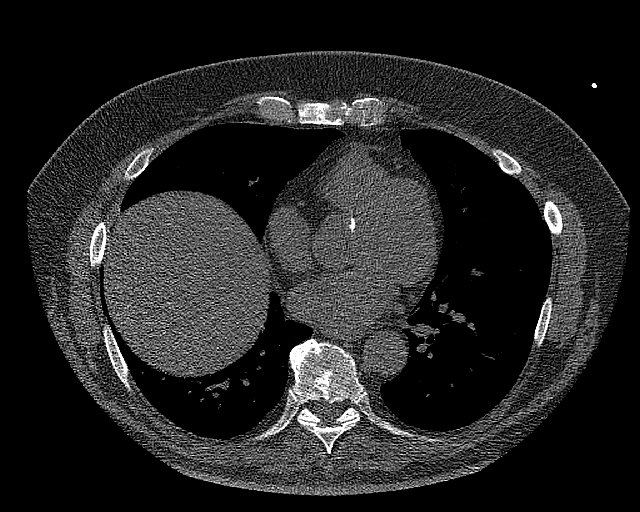
[im 53/80  vessel]
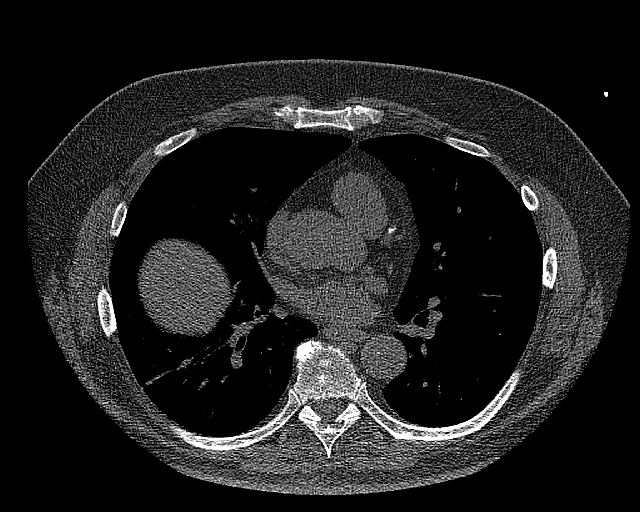
[im 66/80  vessel]
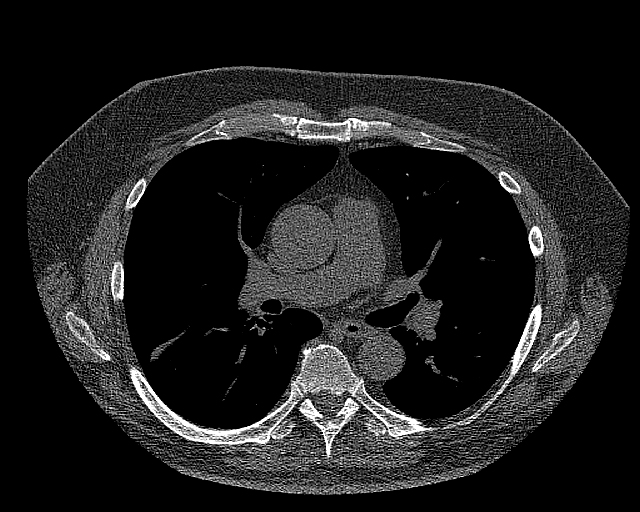

[14 of 20 positions shown; findings below may reference images not displayed]

FINDINGS: CORONARY CALCIUM SCORES:

Left Main: 0

LAD: 126

LCx:

RCA:

Total Agatston Score: 154

[HOSPITAL] percentile: 53

AORTA MEASUREMENTS:

Ascending Aorta: 37 mm

Descending Aorta: 29 mm

OTHER FINDINGS:

Cardiovascular: Tortuous thoracic aorta. Normal heart size, without
pericardial effusion.

Mediastinum/Nodes: No imaged thoracic adenopathy.

Lungs/Pleura: No pleural fluid.  Lingular and right base scarring.

Upper Abdomen: Normal imaged portions of the liver, stomach,
gallbladder.

Musculoskeletal: Moderate thoracic spondylosis.
IMPRESSION: 1. Total Agatston score of 154, corresponding to 53rd percentile for
age, sex, and race based cohort.
2. No acute process in the imaged chest.

## 2022-06-16 MED ORDER — ALPRAZOLAM 0.5 MG PO TABS: 0.5000 mg | ORAL_TABLET | Freq: Three times a day (TID) | ORAL | 5 refills | Status: DC | PRN

## 2022-06-16 MED ORDER — DULOXETINE HCL 60 MG PO CPEP
120.0000 mg | ORAL_CAPSULE | Freq: Every day | ORAL | 1 refills | Status: DC
Start: 2022-06-16 — End: 2023-01-06

## 2022-06-16 MED ORDER — BUSPIRONE HCL 30 MG PO TABS: 30.0000 mg | ORAL_TABLET | Freq: Two times a day (BID) | ORAL | 1 refills | Status: DC

## 2022-06-16 MED ORDER — GABAPENTIN 300 MG PO CAPS
300.0000 mg | ORAL_CAPSULE | Freq: Two times a day (BID) | ORAL | 1 refills | Status: AC
Start: 2022-06-16 — End: ?

## 2022-06-16 MED ORDER — BUPROPION HCL ER (XL) 150 MG PO TB24: 150.0000 mg | ORAL_TABLET | Freq: Every day | ORAL | 1 refills | Status: DC

## 2022-06-16 MED ORDER — RISPERIDONE 1 MG PO TABS: 1.0000 mg | ORAL_TABLET | Freq: Every day | ORAL | 1 refills | Status: DC

## 2022-06-16 NOTE — Progress Notes (Signed)
[facility]  Patient ID: Timothy Hammond,  MRN: [id number]  PCP: Crist Infante, MD  Date of Evaluation: 06/16/2022 time spent:20 minutes  Chief Complaint:  Chief Complaint   Anxiety; Depression; Insomnia; Follow-up    HISTORY/CURRENT STATUS: For routine med check.  Doing well except the Xanax doesn't help as much as it did. He still works part-time (delivers cars/auto parts) and driving over bridges or going through a tunnel makes him extremely anxious.  He takes Xanax 0.5 mg usually 4 times a day and knows it helps some but not as much as it used to.  When he gets super anxious he will get sweaty palms and feels jittery inside.  No tachycardia or palpitations.  No shortness of breath.  Most of the time it is a feeling of being overwhelmed, like something bad may happen at any time.  Patient is able to enjoy things.  Energy and motivation are good.   No extreme sadness, tearfulness, or feelings of hopelessness.  Sleeps well but does have to take the Ambien every night.  If he does not take it he cannot go to sleep.  Usually sleeps 6 to 7 hours per night.  He does take naps during the day sometimes.  ADLs and personal hygiene are normal.   Denies any changes in concentration, making decisions, or remembering things.  Appetite has not changed.  Weight is stable.  Denies suicidal or homicidal thoughts.  Patient denies increased energy with decreased need for sleep, increased talkativeness, racing thoughts, impulsivity or risky behaviors, increased spending, increased libido, grandiosity, increased irritability or anger, paranoia, or hallucinations.  Denies dizziness, syncope, seizures, numbness, tingling, tremor, tics, unsteady gait, slurred speech, confusion.  Denies muscle or joint pain, stiffness, or dystonia. Denies unexplained weight loss, frequent infections, or sores that heal slowly.  No polyphagia, polydipsia, or polyuria. Denies visual changes or paresthesias.    Individual Medical History/ Review of Systems: Changes? :No      Past medications for mental health diagnoses include: Celexa, Xanax, Prozac, Lunesta, Ambien, Paxil, Wellbutrin, Cymbalta, Ativan, BuSpar, Gabapentin, Klonopin  Allergies: Patient has no known allergies.  Current Medications:  Current Outpatient Medications:    metFORMIN (GLUCOPHAGE) 500 MG tablet, Take 500 mg by mouth 2 (two) times daily with a meal., Disp: , Rfl:    NIFEdipine (PROCARDIA XL/ADALAT-CC) 90 MG 24 hr tablet, Take 90 mg by mouth daily., Disp: , Rfl:    zolpidem (AMBIEN) 10 MG tablet, TAKE 1 TABLET BY MOUTH AT BEDTIME AS NEEDED FOR SLEEP, Disp: 90 tablet, Rfl: 0   ALPRAZolam (XANAX) 0.5 MG tablet, Take 1-2 tablets (0.5-1 mg total) by mouth 3 (three) times daily as needed for anxiety. Max 5 pills, Disp: 150 tablet, Rfl: 5   buPROPion (WELLBUTRIN XL) 150 MG 24 hr tablet, Take 1 tablet (150 mg total) by mouth daily., Disp: 90 tablet, Rfl: 1   busPIRone (BUSPAR) 30 MG tablet, Take 1 tablet (30 mg total) by mouth 2 (two) times daily., Disp: 180 tablet, Rfl: 1   DULoxetine (CYMBALTA) 60 MG capsule, Take 2 capsules (120 mg total) by mouth daily., Disp: 180 capsule, Rfl: 1   fluticasone (FLONASE) 50 MCG/ACT nasal spray, Place 2 sprays into both nostrils daily. (Patient not taking: Reported on 12/10/2021), Disp: 16 g, Rfl: 0   gabapentin (NEURONTIN) 300 MG capsule, Take 1 capsule (300 mg total) by mouth 2 (two) times daily with breakfast and lunch., Disp: 180 capsule, Rfl: 1   risperiDONE (RISPERDAL) 1 MG tablet, Take  1 tablet (1 mg total) by mouth at bedtime., Disp: 90 tablet, Rfl: 1 Medication Side Effects: none  Family Medical/ Social History: Changes?  No  MENTAL HEALTH EXAM:  There were no vitals taken for this visit.There is no height or weight on file to calculate BMI.  General Appearance: Casual, Well Groomed, and Obese  Eye Contact:  Good  Speech:  Clear and Coherent  Volume:  Normal  Mood:  Euthymic   Affect:  Congruent  Thought Process:  Goal Directed and Descriptions of Associations: Intact  Orientation:  Full (Time, Place, and Person)  Thought Content: Logical   Suicidal Thoughts:  No  Homicidal Thoughts:  No  Memory:  WNL  Judgement:  Intact  Insight:  Good  Psychomotor Activity:  Normal  Concentration:  Concentration: Good and Attention Span: Good  Recall:  Good  Fund of Knowledge: Good  Language: Good  Assets:  Desire for Improvement Financial Resources/Insurance Housing Transportation Vocational/Educational  ADL's:  Intact  Cognition: WNL  Prognosis:  Good   His PCP follows his labs.  He has physical scheduled in March.  DIAGNOSES:    ICD-10-CM   1. Generalized anxiety disorder  F41.1     2. Recurrent major depressive disorder, in partial remission ([place])  F33.41     3. Insomnia, unspecified type  G47.00     4. Situational anxiety  F41.8       Receiving Psychotherapy: No   RECOMMENDATIONS: PDMP was reviewed.  No controlled substances listed.   I provided 20 minutes of face to face time during this encounter, including time spent before and after the visit in records review, medical decision making, counseling pertinent to today's visit, and charting.   Recommend increasing Xanax.  Will keep 0.5 mg dose, however he can take a max of 5 pills daily.  He can take 2 at 1 time if he prefers and that is more effective, just not over 5 pills/day.  He verbalizes understanding.  Continue Xanax 0.5 mg, 1-2 po tid times daily as needed.  (Or 1 p.o. every 4 to 6 hours depending on the need,) max dose 5 pills/day. Continue Wellbutrin XL 150 mg, 1 p.o. every morning. Continue BuSpar to 30 mg p.o. twice daily. Continue Cymbalta 60 mg, 2 p.o. daily. Continue Risperdal 1 mg nightly. Continue Ambien 10 mg nightly as needed. Return in 6 months.  Donnal Moat, PA-C

## 2022-06-30 ENCOUNTER — Telehealth: Payer: Self-pay | Admitting: Physician Assistant

## 2022-06-30 NOTE — Telephone Encounter (Signed)
Patient called in stating that insurance needed approval to increase his prescription for Alprazolam 0.'5mg'$  from four pills to five. Ph: [phone] Appt 8/14

## 2022-06-30 NOTE — Telephone Encounter (Signed)
Thought patient needed a PA. I didn't see anything in [facility]. Called pharmacy and patient picked up medication on 2/17. Tried to call patient to see what the issue is and no answer and no way to leave a message.

## 2022-07-13 DIAGNOSIS — E1129 Type 2 diabetes mellitus with other diabetic kidney complication: Secondary | ICD-10-CM | POA: Diagnosis not present

## 2022-07-13 DIAGNOSIS — Z1212 Encounter for screening for malignant neoplasm of rectum: Secondary | ICD-10-CM | POA: Diagnosis not present

## 2022-07-13 DIAGNOSIS — I10 Essential (primary) hypertension: Secondary | ICD-10-CM | POA: Diagnosis not present

## 2022-07-13 DIAGNOSIS — E785 Hyperlipidemia, unspecified: Secondary | ICD-10-CM | POA: Diagnosis not present

## 2022-07-13 DIAGNOSIS — Z125 Encounter for screening for malignant neoplasm of prostate: Secondary | ICD-10-CM | POA: Diagnosis not present

## 2022-07-20 DIAGNOSIS — Z23 Encounter for immunization: Secondary | ICD-10-CM | POA: Diagnosis not present

## 2022-07-20 DIAGNOSIS — N1831 Chronic kidney disease, stage 3a: Secondary | ICD-10-CM | POA: Diagnosis not present

## 2022-07-20 DIAGNOSIS — E1129 Type 2 diabetes mellitus with other diabetic kidney complication: Secondary | ICD-10-CM | POA: Diagnosis not present

## 2022-07-20 DIAGNOSIS — E785 Hyperlipidemia, unspecified: Secondary | ICD-10-CM | POA: Diagnosis not present

## 2022-07-20 DIAGNOSIS — I129 Hypertensive chronic kidney disease with stage 1 through stage 4 chronic kidney disease, or unspecified chronic kidney disease: Secondary | ICD-10-CM | POA: Diagnosis not present

## 2022-07-20 DIAGNOSIS — R82998 Other abnormal findings in urine: Secondary | ICD-10-CM | POA: Diagnosis not present

## 2022-07-20 DIAGNOSIS — Z0000 Encounter for general adult medical examination without abnormal findings: Secondary | ICD-10-CM | POA: Diagnosis not present

## 2022-08-23 ENCOUNTER — Telehealth: Payer: Self-pay | Admitting: Physician Assistant

## 2022-08-23 NOTE — Telephone Encounter (Signed)
[facility] sent a PA request for Alprazolam 0.5mg  tabl.  Key #[id number]

## 2022-08-31 ENCOUNTER — Other Ambulatory Visit: Payer: Self-pay | Admitting: Physician Assistant

## 2022-08-31 NOTE — Telephone Encounter (Signed)
Due 5/5

## 2022-09-02 ENCOUNTER — Telehealth: Payer: Self-pay | Admitting: Physician Assistant

## 2022-09-02 NOTE — Telephone Encounter (Signed)
Pt called and said that he is running out of xanax. A PA was done on April 22 nd. Has it been approved or can it be re written for 4 a day since he is running low. Please call him at [phone]

## 2022-09-03 NOTE — Telephone Encounter (Signed)
There is another message about his PA as well. Tried submitting through [facility] but [employer] has to be submitted through [facility] instead. Completed his PA verbally over the phone, requested urgent.

## 2022-09-03 NOTE — Telephone Encounter (Signed)
Once contacting [employer] they informed me that they no longer use [facility] and use [facility]. I did complete his PA over the phone marked URGENT, we will get a phone call and fax with determination.

## 2022-09-03 NOTE — Telephone Encounter (Signed)
Will contact [employer]. Not sure if error with [facility] or [employer].

## 2022-09-14 DIAGNOSIS — H40033 Anatomical narrow angle, bilateral: Secondary | ICD-10-CM | POA: Diagnosis not present

## 2022-09-14 DIAGNOSIS — E119 Type 2 diabetes mellitus without complications: Secondary | ICD-10-CM | POA: Diagnosis not present

## 2022-09-14 DIAGNOSIS — H524 Presbyopia: Secondary | ICD-10-CM | POA: Diagnosis not present

## 2022-10-08 DIAGNOSIS — E1129 Type 2 diabetes mellitus with other diabetic kidney complication: Secondary | ICD-10-CM | POA: Diagnosis not present

## 2022-10-08 DIAGNOSIS — R350 Frequency of micturition: Secondary | ICD-10-CM | POA: Diagnosis not present

## 2022-10-28 DIAGNOSIS — K080 Exfoliation of teeth due to systemic causes: Secondary | ICD-10-CM | POA: Diagnosis not present

## 2022-11-29 ENCOUNTER — Other Ambulatory Visit: Payer: Self-pay | Admitting: Physician Assistant

## 2022-11-29 NOTE — Telephone Encounter (Signed)
Due 8/1

## 2022-12-15 ENCOUNTER — Ambulatory Visit (INDEPENDENT_AMBULATORY_CARE_PROVIDER_SITE_OTHER): Payer: Self-pay | Admitting: Physician Assistant

## 2022-12-15 DIAGNOSIS — Z91199 Patient's noncompliance with other medical treatment and regimen due to unspecified reason: Secondary | ICD-10-CM

## 2022-12-15 NOTE — Progress Notes (Signed)
No show

## 2023-01-04 ENCOUNTER — Other Ambulatory Visit: Payer: Self-pay | Admitting: Physician Assistant

## 2023-01-04 NOTE — Telephone Encounter (Signed)
Please call to schedule an appt, was due last month and canceled.

## 2023-01-07 ENCOUNTER — Telehealth: Payer: Self-pay | Admitting: Physician Assistant

## 2023-01-07 MED ORDER — DULOXETINE HCL 60 MG PO CPEP: 120.0000 mg | ORAL_CAPSULE | Freq: Every day | ORAL | 0 refills | Status: DC

## 2023-01-07 NOTE — Telephone Encounter (Signed)
Next visit is 02/03/23. Requesting Duloxetine called to:  [facility] [address] [place]), [place] - [address]   Phone: [phone]  Fax: [phone]    Timothy Hammond cancelled his appt today due to sickness.

## 2023-01-07 NOTE — Telephone Encounter (Signed)
Pt has appt 10/3

## 2023-01-07 NOTE — Telephone Encounter (Signed)
sent 

## 2023-01-30 ENCOUNTER — Other Ambulatory Visit: Payer: Self-pay | Admitting: Physician Assistant

## 2023-02-03 ENCOUNTER — Ambulatory Visit: Payer: Medicare Other | Admitting: Physician Assistant

## 2023-02-03 ENCOUNTER — Encounter: Payer: Self-pay | Admitting: Physician Assistant

## 2023-02-03 DIAGNOSIS — F411 Generalized anxiety disorder: Secondary | ICD-10-CM

## 2023-02-03 DIAGNOSIS — G4700 Insomnia, unspecified: Secondary | ICD-10-CM | POA: Diagnosis not present

## 2023-02-03 DIAGNOSIS — F3341 Major depressive disorder, recurrent, in partial remission: Secondary | ICD-10-CM | POA: Diagnosis not present

## 2023-02-03 DIAGNOSIS — F418 Other specified anxiety disorders: Secondary | ICD-10-CM | POA: Diagnosis not present

## 2023-02-03 MED ORDER — BUSPIRONE HCL 30 MG PO TABS: 30.0000 mg | ORAL_TABLET | Freq: Two times a day (BID) | ORAL | 1 refills | Status: DC

## 2023-02-03 MED ORDER — BUPROPION HCL ER (XL) 150 MG PO TB24: 150.0000 mg | ORAL_TABLET | Freq: Every day | ORAL | 1 refills | Status: DC

## 2023-02-03 MED ORDER — ZOLPIDEM TARTRATE 10 MG PO TABS: 10.0000 mg | ORAL_TABLET | Freq: Every evening | ORAL | 1 refills | Status: DC | PRN

## 2023-02-03 MED ORDER — DULOXETINE HCL 60 MG PO CPEP: 120.0000 mg | ORAL_CAPSULE | Freq: Every day | ORAL | 1 refills | Status: DC

## 2023-02-03 MED ORDER — RISPERIDONE 1 MG PO TABS: 1.0000 mg | ORAL_TABLET | Freq: Every day | ORAL | 1 refills | Status: DC

## 2023-02-03 MED ORDER — ALPRAZOLAM 0.5 MG PO TABS: 0.5000 mg | ORAL_TABLET | Freq: Three times a day (TID) | ORAL | 5 refills | Status: DC | PRN

## 2023-02-03 MED ORDER — GABAPENTIN 300 MG PO CAPS: 300.0000 mg | ORAL_CAPSULE | Freq: Two times a day (BID) | ORAL | 1 refills | Status: DC

## 2023-02-03 NOTE — Progress Notes (Signed)
[facility]  Patient ID: Timothy Hammond,  MRN: [id number]  PCP: Rodrigo Ran, MD  Date of Evaluation: 02/03/2023 Time spent: 25 minutes  Chief Complaint:  Chief Complaint   Anxiety; Depression; Insomnia; Follow-up    HISTORY/CURRENT STATUS: HPI For 6 month med check.  Timothy Hammond is doing great.  States he feels better now than he has in a long time.  He does not want to change anything.  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  Patient is able to enjoy things.  He went on vacation a few months ago to see an old college friend and had a good time.  Energy and motivation are good.  No extreme sadness, tearfulness, or feelings of hopelessness.  Sleeps well but has to take the Ambien or he cannot fall asleep or stay asleep.  ADLs and personal hygiene are normal.   Denies any changes in concentration, making decisions, or remembering things.  Appetite has not changed.  Weight is stable.  Anxiety is well controlled.  He does take the Xanax fairly routinely or else he will have panic attacks.  He still gets very nervous in social situations if he does not take the Xanax beforehand.  Denies suicidal or homicidal thoughts.  Patient denies increased energy with decreased need for sleep, increased talkativeness, racing thoughts, impulsivity or risky behaviors, increased spending, increased libido, grandiosity, increased irritability or anger, paranoia, or hallucinations.  Review of Systems  Constitutional: Negative.   HENT: Negative.    Eyes: Negative.   Respiratory: Negative.    Cardiovascular: Negative.   Gastrointestinal: Negative.   Genitourinary: Negative.   Musculoskeletal: Negative.   Skin: Negative.   Neurological: Negative.   Endo/Heme/Allergies: Negative.   Psychiatric/Behavioral:         See HPI   Individual Medical History/ Review of Systems: Changes? :No   Allergies: Patient has no known allergies.  Current Medications:   Current Outpatient Medications:    metFORMIN (GLUCOPHAGE) 500 MG tablet, Take 500 mg by mouth 2 (two) times daily with a meal., Disp: , Rfl:    NIFEdipine (PROCARDIA XL/ADALAT-CC) 90 MG 24 hr tablet, Take 90 mg by mouth daily., Disp: , Rfl:    ALPRAZolam (XANAX) 0.5 MG tablet, Take 1-2 tablets (0.5-1 mg total) by mouth 3 (three) times daily as needed for anxiety. Max 5 pills, Disp: 150 tablet, Rfl: 5   buPROPion (WELLBUTRIN XL) 150 MG 24 hr tablet, Take 1 tablet (150 mg total) by mouth daily., Disp: 90 tablet, Rfl: 1   busPIRone (BUSPAR) 30 MG tablet, Take 1 tablet (30 mg total) by mouth 2 (two) times daily., Disp: 180 tablet, Rfl: 1   DULoxetine (CYMBALTA) 60 MG capsule, Take 2 capsules (120 mg total) by mouth daily., Disp: 180 capsule, Rfl: 1   gabapentin (NEURONTIN) 300 MG capsule, Take 1 capsule (300 mg total) by mouth 2 (two) times daily with breakfast and lunch., Disp: 180 capsule, Rfl: 1   risperiDONE (RISPERDAL) 1 MG tablet, Take 1 tablet (1 mg total) by mouth at bedtime., Disp: 90 tablet, Rfl: 1   zolpidem (AMBIEN) 10 MG tablet, Take 1 tablet (10 mg total) by mouth at bedtime as needed. for sleep, Disp: 90 tablet, Rfl: 1 Medication Side Effects: none  Family Medical/ Social History: Changes? No  MENTAL HEALTH EXAM:  There were no vitals taken for this visit.There is no height or weight on file to calculate BMI.  General Appearance: Casual, Well Groomed, and Obese  Eye Contact:  Good  Speech:  Clear and Coherent and Normal Rate  Volume:  Normal  Mood:  Euthymic  Affect:  Congruent  Thought Process:  Goal Directed and Descriptions of Associations: Circumstantial  Orientation:  Full (Time, Place, and Person)  Thought Content: Logical   Suicidal Thoughts:  No  Homicidal Thoughts:  No  Memory:  WNL  Judgement:  Good  Insight:  Good  Psychomotor Activity:  Normal  Concentration:  Concentration: Good  Recall:  Good  Fund of Knowledge: Good  Language: Good  Assets:  Desire for  Improvement Financial Resources/Insurance Housing Transportation Vocational/Educational  ADL's:  Intact  Cognition: WNL  Prognosis:  Good   PCP follows labs  DIAGNOSES:    ICD-10-CM   1. Recurrent major depressive disorder, in partial remission (HCC)  F33.41     2. Generalized anxiety disorder  F41.1     3. Situational anxiety  F41.8     4. Insomnia, unspecified type  G47.00      Receiving Psychotherapy: No   RECOMMENDATIONS:  PDMP reviewed.  Gabapentin filled 12/13/2022.  Xanax filled 12/12/2022.  Ambien filled 12/02/2022. I provided 25 minutes of face to face time during this encounter, including time spent before and after the visit in records review, medical decision making, counseling pertinent to today's visit, and charting.   He is doing great so no changes in treatment are needed.  Continue Xanax 0.5 mg, 1-2 p.o. 3 times daily as needed with a maximum of 5 pills daily. Continue Wellbutrin XL 150 mg, 1 p.o. daily. Continue BuSpar 30 mg, 1 p.o. twice daily. Continue Cymbalta 60 mg, 2 p.o. daily. Continue gabapentin 300 mg, 1 p.o. at breakfast and 1 at lunch. Continue Risperdal 1 mg, 1 p.o. nightly. Continue Ambien 10 mg, 1 p.o. nightly as needed sleep. Return in 6 months.  Melony Overly, PA-C

## 2023-02-06 NOTE — Telephone Encounter (Signed)
This was approved through pt's medicare.

## 2023-02-06 NOTE — Telephone Encounter (Signed)
This had been approved through his medicare

## 2023-04-06 DIAGNOSIS — K080 Exfoliation of teeth due to systemic causes: Secondary | ICD-10-CM | POA: Diagnosis not present

## 2023-05-18 DIAGNOSIS — E119 Type 2 diabetes mellitus without complications: Secondary | ICD-10-CM | POA: Diagnosis not present

## 2023-05-18 DIAGNOSIS — Z1211 Encounter for screening for malignant neoplasm of colon: Secondary | ICD-10-CM | POA: Diagnosis not present

## 2023-06-16 DIAGNOSIS — Z1211 Encounter for screening for malignant neoplasm of colon: Secondary | ICD-10-CM | POA: Diagnosis not present

## 2023-06-19 ENCOUNTER — Other Ambulatory Visit: Payer: Self-pay | Admitting: Physician Assistant

## 2023-08-04 ENCOUNTER — Ambulatory Visit: Payer: Medicare Other | Admitting: Physician Assistant

## 2023-08-04 ENCOUNTER — Encounter: Payer: Self-pay | Admitting: Physician Assistant

## 2023-08-04 DIAGNOSIS — F411 Generalized anxiety disorder: Secondary | ICD-10-CM

## 2023-08-04 DIAGNOSIS — F3341 Major depressive disorder, recurrent, in partial remission: Secondary | ICD-10-CM | POA: Diagnosis not present

## 2023-08-04 DIAGNOSIS — G4700 Insomnia, unspecified: Secondary | ICD-10-CM

## 2023-08-04 MED ORDER — ZOLPIDEM TARTRATE 10 MG PO TABS: 10.0000 mg | ORAL_TABLET | Freq: Every evening | ORAL | 1 refills | Status: DC | PRN

## 2023-08-04 MED ORDER — ALPRAZOLAM 0.5 MG PO TABS: 0.5000 mg | ORAL_TABLET | Freq: Three times a day (TID) | ORAL | 5 refills | Status: DC | PRN

## 2023-08-04 MED ORDER — BUSPIRONE HCL 30 MG PO TABS: 30.0000 mg | ORAL_TABLET | Freq: Two times a day (BID) | ORAL | 0 refills | Status: DC

## 2023-08-04 MED ORDER — RISPERIDONE 1 MG PO TABS: 1.0000 mg | ORAL_TABLET | Freq: Every day | ORAL | 1 refills | Status: DC

## 2023-08-04 MED ORDER — GABAPENTIN 300 MG PO CAPS: 300.0000 mg | ORAL_CAPSULE | Freq: Two times a day (BID) | ORAL | 1 refills | Status: DC

## 2023-08-04 MED ORDER — DULOXETINE HCL 60 MG PO CPEP: 120.0000 mg | ORAL_CAPSULE | Freq: Every day | ORAL | 1 refills | Status: DC

## 2023-08-04 MED ORDER — BUPROPION HCL ER (XL) 150 MG PO TB24
150.0000 mg | ORAL_TABLET | Freq: Every day | ORAL | 1 refills | Status: DC
Start: 2023-08-04 — End: 2024-02-02

## 2023-08-04 NOTE — Progress Notes (Signed)
 [facility]  Patient ID: Timothy Hammond,  MRN: [id number]  PCP: Rodrigo Ran, MD  Date of Evaluation: 08/04/2023 Time spent:20 minutes  Chief Complaint:  Chief Complaint   Anxiety; Depression; Insomnia; Follow-up    HISTORY/CURRENT STATUS: HPI For 6 month med check.  Patient is able to enjoy things.  Energy and motivation are good.  Work is going well. Still works part-time.  Enjoys playing golf when he can.  No extreme sadness, tearfulness, or feelings of hopelessness.  Sleeps well but he does require Ambien most of the time or he cannot go to sleep or stay asleep.  ADLs and personal hygiene are normal.   Denies any changes in concentration, making decisions, or remembering things.  Appetite has not changed.  Weight is stable.  Anxiety is well controlled.  Xanax helps.  Denies suicidal or homicidal thoughts.  Patient denies increased energy with decreased need for sleep, increased talkativeness, racing thoughts, impulsivity or risky behaviors, increased spending, increased libido, grandiosity, increased irritability or anger, paranoia, or hallucinations.  Denies dizziness, syncope, seizures, numbness, tingling, tremor, tics, unsteady gait, slurred speech, confusion. Denies muscle or joint pain, stiffness, or dystonia. Denies unexplained weight loss, frequent infections, or sores that heal slowly.  No polyphagia, polydipsia, or polyuria. Denies visual changes or paresthesias.   Individual Medical History/ Review of Systems: Changes? :Yes he was started on Gemtesa for incontinence.  It has helped.  Allergies: Patient has no known allergies.  Current Medications:  Current Outpatient Medications:    GEMTESA 75 MG TABS, Take 1 tablet by mouth daily., Disp: , Rfl:    metFORMIN (GLUCOPHAGE) 500 MG tablet, Take 500 mg by mouth 2 (two) times daily with a meal., Disp: , Rfl:    NIFEdipine (PROCARDIA XL/ADALAT-CC) 90 MG 24 hr tablet, Take 90 mg by mouth daily., Disp: , Rfl:     ALPRAZolam (XANAX) 0.5 MG tablet, Take 1-2 tablets (0.5-1 mg total) by mouth 3 (three) times daily as needed for anxiety. Max 5 pills, Disp: 150 tablet, Rfl: 5   buPROPion (WELLBUTRIN XL) 150 MG 24 hr tablet, Take 1 tablet (150 mg total) by mouth daily., Disp: 90 tablet, Rfl: 1   busPIRone (BUSPAR) 30 MG tablet, Take 1 tablet (30 mg total) by mouth 2 (two) times daily., Disp: 180 tablet, Rfl: 0   DULoxetine (CYMBALTA) 60 MG capsule, Take 2 capsules (120 mg total) by mouth daily., Disp: 180 capsule, Rfl: 1   gabapentin (NEURONTIN) 300 MG capsule, Take 1 capsule (300 mg total) by mouth 2 (two) times daily with breakfast and lunch., Disp: 180 capsule, Rfl: 1   risperiDONE (RISPERDAL) 1 MG tablet, Take 1 tablet (1 mg total) by mouth at bedtime., Disp: 90 tablet, Rfl: 1   zolpidem (AMBIEN) 10 MG tablet, Take 1 tablet (10 mg total) by mouth at bedtime as needed. for sleep, Disp: 90 tablet, Rfl: 1 Medication Side Effects: none  Family Medical/ Social History: Changes? No  MENTAL HEALTH EXAM:  There were no vitals taken for this visit.There is no height or weight on file to calculate BMI.  General Appearance: Casual, Well Groomed, and Obese  Eye Contact:  Good  Speech:  Clear and Coherent and Normal Rate  Volume:  Normal  Mood:  Euthymic  Affect:  Congruent  Thought Process:  Goal Directed and Descriptions of Associations: Circumstantial  Orientation:  Full (Time, Place, and Person)  Thought Content: Logical   Suicidal Thoughts:  No  Homicidal Thoughts:  No  Memory:  WNL  Judgement:  Good  Insight:  Good  Psychomotor Activity:  Normal  Concentration:  Concentration: Good  Recall:  Good  Fund of Knowledge: Good  Language: Good  Assets:  Communication Skills Desire for Improvement Financial Resources/Insurance Housing Transportation Vocational/Educational  ADL's:  Intact  Cognition: WNL  Prognosis:  Good   PCP follows labs  DIAGNOSES:    ICD-10-CM   1. Recurrent major depressive  disorder, in partial remission (HCC)  F33.41     2. Generalized anxiety disorder  F41.1     3. Insomnia, unspecified type  G47.00       Receiving Psychotherapy: No   RECOMMENDATIONS:  PDMP reviewed.  Gabapentin filled 05/26/2023.  Xanax filled 05/29/2023.  Ambien filled 06/04/2023. I provided 20 minutes of face to face time during this encounter, including time spent before and after the visit in records review, medical decision making, counseling pertinent to today's visit, and charting.   He is doing very well on current medications so no changes need to be made.  Continue Xanax 0.5 mg, 1-2 p.o. 3 times daily as needed with a maximum of 5 pills daily. Continue Wellbutrin XL 150 mg, 1 p.o. daily. Continue BuSpar 30 mg, 1 p.o. twice daily. Continue Cymbalta 60 mg, 2 p.o. daily. Continue gabapentin 300 mg, 1 p.o. at breakfast and 1 at lunch. Continue Risperdal 1 mg, 1 p.o. nightly. Continue Ambien 10 mg, 1 p.o. nightly as needed sleep. Return in 6 months.  Melony Overly, PA-C

## 2023-09-20 ENCOUNTER — Telehealth: Payer: Self-pay | Admitting: Physician Assistant

## 2023-09-20 NOTE — Telephone Encounter (Signed)
 Timothy Hammond called again at 1:30 to give address of the [facility] and again confirmed he wanted all meds to be sent to them.  [address] [place], [place], [place] [address], [phone]

## 2023-09-20 NOTE — Telephone Encounter (Signed)
 Next appt is 02/02/24. Requesting refills on the following:  Buspirone , Duloxetine , Gabapentin , Bupropion , Alprazolam , Zolpidem , & Risperidone  called to:   [id number]), [place] - [address]   Phone: [phone]  Fax: [phone]

## 2023-09-20 NOTE — Telephone Encounter (Signed)
 Patient has RF available for all medications at [facility]. I called to let him know and he said he wanted them transferred to a [facility] mail order pharmacy.  He said that he thought he did that yesterday via [facility].

## 2023-09-22 ENCOUNTER — Other Ambulatory Visit: Payer: Self-pay

## 2023-09-22 MED ORDER — ALPRAZOLAM 0.5 MG PO TABS: 0.5000 mg | ORAL_TABLET | Freq: Three times a day (TID) | ORAL | 4 refills | Status: DC | PRN

## 2023-09-22 NOTE — Telephone Encounter (Signed)
 [facility] said Rx were transferred to [facility] mail order.  He filled 90-day supply of zolpidem  on 4/29, so too early to send Rx for that. Pended alprazolam .

## 2023-09-28 ENCOUNTER — Telehealth: Payer: Self-pay | Admitting: Physician Assistant

## 2023-09-28 ENCOUNTER — Other Ambulatory Visit: Payer: Self-pay

## 2023-09-28 MED ORDER — BUSPIRONE HCL 30 MG PO TABS: 30.0000 mg | ORAL_TABLET | Freq: Two times a day (BID) | ORAL | 0 refills | Status: DC

## 2023-09-28 NOTE — Telephone Encounter (Signed)
 Pt called at 11:27 asking why  Zolpidem  and Risperdone did not get transferred from [facility] to [facility] order. I read him the previous note that the Zolpidem  was too early to be refilled.  Will you make a note to send them there or does he need to just call back closer to when they need to refilled?  Next appt 10/2

## 2023-09-28 NOTE — Telephone Encounter (Addendum)
 I did not transfer the medications. He had them transferred to [facility] mail order. I did confirm with local pharmacy that they were transferred though.  He had recently received a 90-day supply of Zolpidem . Alprazolam  RF was sent. Called [facility] and they said that risperidone  was transferred. I did receive a fax today from [facility] asking for a 90-day supply of buspirone  with directions for how to take. This was evidently transferred incorrectly and does not reflect how the script was sent to [facility]. Sent a new Rx to [facility]. Patient aware.

## 2023-10-06 ENCOUNTER — Telehealth: Payer: Self-pay | Admitting: Physician Assistant

## 2023-10-06 NOTE — Telephone Encounter (Signed)
 [facility] mail service lvm @ 10:26 am stating insurance will only cover up to 4/d Alprazolam  0.5 mg. You can change Rx to 1 mg take 1/2-1 tablet daily PRN anxiety total of 4/d or will require PA for total of 5/d. Please advise @ [phone]

## 2023-10-07 NOTE — Telephone Encounter (Signed)
 Please see message in regards to qty of alprazolam .

## 2023-10-07 NOTE — Telephone Encounter (Signed)
 May have to change strength with instructions they prefer that with a lower quantity but a PA has been submitted to [facility] for quantity.

## 2023-10-07 NOTE — Telephone Encounter (Signed)
 Approval received for quantity limit effective through 10/06/24

## 2023-10-12 DIAGNOSIS — Z125 Encounter for screening for malignant neoplasm of prostate: Secondary | ICD-10-CM | POA: Diagnosis not present

## 2023-10-12 DIAGNOSIS — E785 Hyperlipidemia, unspecified: Secondary | ICD-10-CM | POA: Diagnosis not present

## 2023-10-12 DIAGNOSIS — E1129 Type 2 diabetes mellitus with other diabetic kidney complication: Secondary | ICD-10-CM | POA: Diagnosis not present

## 2023-10-12 DIAGNOSIS — Z1212 Encounter for screening for malignant neoplasm of rectum: Secondary | ICD-10-CM | POA: Diagnosis not present

## 2023-10-19 DIAGNOSIS — R82998 Other abnormal findings in urine: Secondary | ICD-10-CM | POA: Diagnosis not present

## 2023-10-19 DIAGNOSIS — Z0000 Encounter for general adult medical examination without abnormal findings: Secondary | ICD-10-CM | POA: Diagnosis not present

## 2023-10-19 DIAGNOSIS — Z1339 Encounter for screening examination for other mental health and behavioral disorders: Secondary | ICD-10-CM | POA: Diagnosis not present

## 2023-10-19 DIAGNOSIS — Z1331 Encounter for screening for depression: Secondary | ICD-10-CM | POA: Diagnosis not present

## 2023-10-19 DIAGNOSIS — I129 Hypertensive chronic kidney disease with stage 1 through stage 4 chronic kidney disease, or unspecified chronic kidney disease: Secondary | ICD-10-CM | POA: Diagnosis not present

## 2023-10-19 DIAGNOSIS — E1122 Type 2 diabetes mellitus with diabetic chronic kidney disease: Secondary | ICD-10-CM | POA: Diagnosis not present

## 2023-10-26 DIAGNOSIS — Z1211 Encounter for screening for malignant neoplasm of colon: Secondary | ICD-10-CM | POA: Diagnosis not present

## 2023-11-23 ENCOUNTER — Other Ambulatory Visit: Payer: Self-pay

## 2023-11-23 MED ORDER — RISPERIDONE 1 MG PO TABS: 1.0000 mg | ORAL_TABLET | Freq: Every day | ORAL | 0 refills | Status: DC

## 2024-01-10 ENCOUNTER — Encounter (HOSPITAL_BASED_OUTPATIENT_CLINIC_OR_DEPARTMENT_OTHER): Payer: Self-pay | Admitting: Urology

## 2024-01-10 ENCOUNTER — Emergency Department (HOSPITAL_BASED_OUTPATIENT_CLINIC_OR_DEPARTMENT_OTHER)
Admission: EM | Admit: 2024-01-10 | Discharge: 2024-01-10 | Disposition: A | Discharge status: Home / Self Care | Attending: Emergency Medicine | Admitting: Emergency Medicine

## 2024-01-10 ENCOUNTER — Other Ambulatory Visit: Payer: Self-pay

## 2024-01-10 DIAGNOSIS — E119 Type 2 diabetes mellitus without complications: Secondary | ICD-10-CM | POA: Insufficient documentation

## 2024-01-10 DIAGNOSIS — R300 Dysuria: Secondary | ICD-10-CM | POA: Insufficient documentation

## 2024-01-10 DIAGNOSIS — Z7984 Long term (current) use of oral hypoglycemic drugs: Secondary | ICD-10-CM | POA: Diagnosis not present

## 2024-01-10 DIAGNOSIS — K5900 Constipation, unspecified: Secondary | ICD-10-CM | POA: Diagnosis not present

## 2024-01-10 DIAGNOSIS — M545 Low back pain, unspecified: Secondary | ICD-10-CM | POA: Diagnosis not present

## 2024-01-10 DIAGNOSIS — I10 Essential (primary) hypertension: Secondary | ICD-10-CM | POA: Diagnosis not present

## 2024-01-10 LAB — CBC WITH DIFFERENTIAL/PLATELET
Abs Immature Granulocytes: 0.02 K/uL (ref 0.00–0.07)
Basophils Absolute: 0.1 K/uL (ref 0.0–0.1)
Basophils Relative: 1 %
Eosinophils Absolute: 0.3 K/uL (ref 0.0–0.5)
Eosinophils Relative: 3 %
HCT: 42.3 % (ref 39.0–52.0)
Hemoglobin: 13.8 g/dL (ref 13.0–17.0)
Immature Granulocytes: 0 %
Lymphocytes Relative: 35 %
Lymphs Abs: 3 K/uL (ref 0.7–4.0)
MCH: 28.7 pg (ref 26.0–34.0)
MCHC: 32.6 g/dL (ref 30.0–36.0)
MCV: 87.9 fL (ref 80.0–100.0)
Monocytes Absolute: 0.6 K/uL (ref 0.1–1.0)
Monocytes Relative: 7 %
Neutro Abs: 4.5 K/uL (ref 1.7–7.7)
Neutrophils Relative %: 54 %
Platelets: 386 K/uL (ref 150–400)
RBC: 4.81 MIL/uL (ref 4.22–5.81)
RDW: 13.5 % (ref 11.5–15.5)
WBC: 8.4 K/uL (ref 4.0–10.5)
nRBC: 0 % (ref 0.0–0.2)

## 2024-01-10 LAB — URINALYSIS, ROUTINE W REFLEX MICROSCOPIC
Glucose, UA: NEGATIVE mg/dL
Hgb urine dipstick: NEGATIVE
Ketones, ur: NEGATIVE mg/dL
Leukocytes,Ua: NEGATIVE
Nitrite: NEGATIVE
Protein, ur: NEGATIVE mg/dL
Specific Gravity, Urine: >=1.03 (ref 1.005–1.030)
pH: 5.5 (ref 5.0–8.0)

## 2024-01-10 LAB — COMPREHENSIVE METABOLIC PANEL WITH GFR
ALT: 13 U/L (ref 0–44)
AST: 14 U/L — ABNORMAL LOW (ref 15–41)
Albumin: 4.3 g/dL (ref 3.5–5.0)
Alkaline Phosphatase: 72 U/L (ref 38–126)
Anion gap: 13 (ref 5–15)
BUN: 16 mg/dL (ref 8–23)
CO2: 23 mmol/L (ref 22–32)
Calcium: 9.4 mg/dL (ref 8.9–10.3)
Chloride: 102 mmol/L (ref 98–111)
Creatinine, Ser: 1.23 mg/dL (ref 0.61–1.24)
GFR, Estimated: >60 mL/min (ref >60)
Glucose, Bld: 116 mg/dL — ABNORMAL HIGH (ref 70–99)
Potassium: 4.4 mmol/L (ref 3.5–5.1)
Sodium: 139 mmol/L (ref 135–145)
Total Bilirubin: 0.3 mg/dL (ref 0.0–1.2)
Total Protein: 7.1 g/dL (ref 6.5–8.1)

## 2024-01-10 NOTE — ED Notes (Signed)
 Bladder scan 65 milters

## 2024-01-10 NOTE — ED Triage Notes (Signed)
 Pt states difficulty urinating x 10 days, now having back pain for approx 2 weeks  Reports frequency and urgency  Also states constipation, last BM 3-4 days ago  Recent diet change to eliminate sugar

## 2024-01-10 NOTE — Discharge Instructions (Signed)
 Augusto JONETTA Pouch  Thank you for allowing us  to take care of you today.  You came to the Emergency Department today because you had some discomfort with urination, and sensation that you are not completely emptying your bladder, additionally you have had some mild low back pain.  Here in the emergency department we checked your urine for infection and we are not seeing any signs of infection.  Additionally we got labs which showed that your kidney number is normal as well as your blood counts.  Given you had back pain we checked how much urine you are retaining in your bladder, and you are retaining a normal amount of urine for your age and sex.  You do not have any numbness around your anus, and the muscle control in your anus is working well, therefore we do not think that you are experiencing spinal cord compression.  It is unclear what exactly is causing your symptoms, however your workup is reassuring against emergent Kasa causes.  We are giving you a referral to follow-up with a urologist for further evaluation outpatient.  To-Do: 1. Please follow-up with your primary doctor within 2 weeks / as soon as possible.   Please return to the Emergency Department or call 911 if you experience have worsening of your symptoms, or do not get better, chest pain, shortness of breath, severe or significantly worsening pain, high fever, severe confusion, pass out or have any reason to think that you need emergency medical care.   We hope you feel better soon.   Mitzie Later, MD Department of Emergency Medicine [facility]

## 2024-01-10 NOTE — ED Provider Notes (Signed)
 [facility] Provider Note   CSN: [id number] Arrival date & time: 01/10/24  1501     History Chief Complaint  Patient presents with   Dysuria   Constipation    HPI: Timothy Hammond is a 73 y.o. male with history pertinent for osteoarthritis, anxiety, depression, diabetes, HTN, HLD who presents complaining of multiple symptoms. Patient arrived via POV.  History provided by patient.  No interpreter required during this encounter.  Patient reports that he has had numerous symptoms.  Reports that several weeks ago he cut sugar out of his diet and has lost 20 pounds, and feels that this may be contributing to his presentation.  Reports that approximately 2 weeks ago he developed lower back pain which has been progressive.  Reports that over the past several days he has had sensation of urinary frequency with incomplete voiding.  Denies prior history of enlarged prostate or prostate infections.  Denies any dysuria.  Reports that he may have had some subjective fever and chills, however nothing that was objectively measured.  Denies any chest pain, shortness of breath, nausea, vomiting, diarrhea.  Reports that he is constipated, has not had a satisfactory bowel movement in 3 to 4 days, however reports that he has been able to pass small amounts of stool.  Denies any perianal anesthesia or bowel or bladder incontinence.  He denies any recent trauma or falls, known history of cancer, history of injection recreational drug use.  Patient's recorded medical, surgical, social, medication list and allergies were reviewed in the Snapshot window as part of the initial history.   Prior to Admission medications   Medication Sig Start Date End Date Taking? Authorizing Provider  ALPRAZolam  (XANAX ) 0.5 MG tablet Take 1-2 tablets (0.5-1 mg total) by mouth 3 (three) times daily as needed for anxiety. Max 5 pills 09/22/23   Rhys Boyer T, PA-C  buPROPion  (WELLBUTRIN  XL)  150 MG 24 hr tablet Take 1 tablet (150 mg total) by mouth daily. 08/04/23   Rhys Boyer T, PA-C  busPIRone  (BUSPAR ) 30 MG tablet Take 1 tablet (30 mg total) by mouth 2 (two) times daily. 09/28/23   Rhys Boyer T, PA-C  DULoxetine  (CYMBALTA ) 60 MG capsule Take 2 capsules (120 mg total) by mouth daily. 08/04/23   Rhys Boyer T, PA-C  gabapentin  (NEURONTIN ) 300 MG capsule Take 1 capsule (300 mg total) by mouth 2 (two) times daily with breakfast and lunch. 08/04/23   Hurst, Teresa T, PA-C  GEMTESA 75 MG TABS Take 1 tablet by mouth daily.    [provider]  metFORMIN  (GLUCOPHAGE ) 500 MG tablet Take 500 mg by mouth 2 (two) times daily with a meal.    [provider]  NIFEdipine  (PROCARDIA  XL/ADALAT -CC) 90 MG 24 hr tablet Take 90 mg by mouth daily.    [provider]  risperiDONE  (RISPERDAL ) 1 MG tablet Take 1 tablet (1 mg total) by mouth at bedtime. 11/23/23   Rhys Boyer DASEN, PA-C  zolpidem  (AMBIEN ) 10 MG tablet Take 1 tablet (10 mg total) by mouth at bedtime as needed. for sleep 08/04/23   Rhys Boyer T, PA-C     Allergies: Patient has no known allergies.   Review of Systems   ROS as per HPI  Physical Exam Updated Vital Signs BP (!) 136/90   Pulse 76   Temp 97.8 F (36.6 C) (Oral)   Resp 18   Ht 6' (1.829 m)   Wt 112.9 kg   SpO2  97%   BMI 33.76 kg/m  Physical Exam Vitals and nursing note reviewed. Exam conducted with a chaperone present.  Constitutional:      General: He is not in acute distress.    Appearance: He is well-developed.  HENT:     Head: Normocephalic and atraumatic.  Eyes:     Conjunctiva/sclera: Conjunctivae normal.  Cardiovascular:     Rate and Rhythm: Normal rate and regular rhythm.     Heart sounds: No murmur heard. Pulmonary:     Effort: Pulmonary effort is normal. No respiratory distress.     Breath sounds: Normal breath sounds.  Abdominal:     Palpations: Abdomen is soft. There is no mass.     Tenderness: There is abdominal  tenderness (Mild, suprapubic). There is no right CVA tenderness, left CVA tenderness, guarding or rebound.  Genitourinary:    Rectum: Normal anal tone.     Comments: Intact perianal sensation, normal rectal tone with robust active squeeze on DRE Musculoskeletal:        General: No swelling.     Cervical back: Neck supple. No bony tenderness.     Thoracic back: No bony tenderness.     Lumbar back: Tenderness (Bilateral paraspinal) present. No bony tenderness.  Skin:    General: Skin is warm and dry.     Capillary Refill: Capillary refill takes less than 2 seconds.  Neurological:     Mental Status: He is alert.  Psychiatric:        Mood and Affect: Mood normal.     ED Course/ Medical Decision Making/ A&P    Procedures Procedures   Medications Ordered in ED Medications - No data to display  Medical Decision Making:   NATE COMMON is a 73 y.o. male who presents for sensation of urinary retention and lower back pain as per above.  Physical exam is pertinent for mild suprapubic tenderness and tenderness to the bilateral paraspinal musculature with normal tone and sensation on DRE.   The differential includes but is not limited to UTI, pyelonephritis, urinary retention, BPH, cauda equina syndrome, lumbar strain.  Independent historian: None  External data reviewed: No pertinent external data  Initial Plan:   Screening labs including CBC and Metabolic panel to evaluate for infectious or metabolic etiology of disease.  Urinalysis with reflex culture ordered to evaluate for UTI or relevant urologic/nephrologic pathology.  Postvoid residual to evaluate for urinary retention  Labs: Ordered, Independent interpretation, and Details: CMP without AKI, emergent electrolyte derangement, emergent LFT abnormality.  CBC without leukocytosis, anemia, thrombocytopenia.  UA without UTI or hematuria  Radiology: Not indicated No results found.  EKG/Medicine tests: Not indicated EKG  Interpretation:    Interventions: Not indicated  See the EMR for full details regarding lab and imaging results.  Patient presents for multiple symptoms of back soreness and dysuria and sensation of incomplete voiding in the setting of intentional weight loss.  Weight loss is not suspicious given it was secondary to active lifestyle modifications made by patient.  However patient's constellation of back pain as well as urinary symptoms is potentially suspicious for cauda equina syndrome as well as other urinary pathologies.  Considered cystitis, pyelonephritis, however patient without UTI on UA, no CVA tenderness to indicate pyelonephritis.  Considered urinary retention, however on postvoid residual patient with 65 cc in bladder which is acceptable value.  Considered cauda equina syndrome, however patient without urinary retention on PVR, normal perianal sensation, and normal rectal tone, therefore symptoms and presentation is not  consistent with cauda equina syndrome, do not feel that patient requires further workup for this.  Feel that patient's muscular tenderness to palpation in his lumbar spine is most consistent with lumbar muscular strain, discussed this with the patient.  With regard to patient's sensation of dysuria and incomplete voiding, unclear etiology, however reassuring labs and exam makes emergent underlying pathology unlikely.  Discussed this with patient at bedside.  Discussed need for follow-up with PCP as well as urology to further evaluate as etiologies.  Patient is comfortable with this plan, discharged.  Presentation is most consistent with acute uncomplicated illness and I did consider and rule out acute life/limb-threatening illness  Discussion of management or test interpretations with external provider(s): Not indicated  Risk Drugs:None  Disposition: DISCHARGE: I believe that the patient is safe for discharge home with outpatient follow-up. Patient was informed of all  pertinent physical exam, laboratory, and imaging findings. Patient's suspected etiology of their symptom presentation was discussed with the patient and all questions were answered. We discussed following up with PCP and urology. I provided thorough ED return precautions. The patient feels safe and comfortable with this plan.  MDM generated using voice dictation software and may contain dictation errors.  Please contact me for any clarification or with any questions.  Clinical Impression:  1. Dysuria      Discharge   Final Clinical Impression(s) / ED Diagnoses Final diagnoses:  Dysuria    Rx / DC Orders ED Discharge Orders          Ordered    Ambulatory referral to Urology        01/10/24 1956             Rogelia Jerilynn RAMAN, MD 01/13/24 1408

## 2024-01-27 ENCOUNTER — Other Ambulatory Visit: Payer: Self-pay | Admitting: Physician Assistant

## 2024-02-02 ENCOUNTER — Encounter: Payer: Self-pay | Admitting: Physician Assistant

## 2024-02-02 ENCOUNTER — Ambulatory Visit: Admitting: Physician Assistant

## 2024-02-02 DIAGNOSIS — F411 Generalized anxiety disorder: Secondary | ICD-10-CM

## 2024-02-02 DIAGNOSIS — G4700 Insomnia, unspecified: Secondary | ICD-10-CM | POA: Diagnosis not present

## 2024-02-02 DIAGNOSIS — F3342 Major depressive disorder, recurrent, in full remission: Secondary | ICD-10-CM

## 2024-02-02 MED ORDER — BUSPIRONE HCL 30 MG PO TABS: 30.0000 mg | ORAL_TABLET | Freq: Two times a day (BID) | ORAL | 1 refills | Status: DC

## 2024-02-02 MED ORDER — ALPRAZOLAM 0.5 MG PO TABS: 0.5000 mg | ORAL_TABLET | Freq: Three times a day (TID) | ORAL | 5 refills | Status: AC | PRN

## 2024-02-02 MED ORDER — ZOLPIDEM TARTRATE 10 MG PO TABS: 10.0000 mg | ORAL_TABLET | Freq: Every evening | ORAL | 1 refills | Status: AC | PRN

## 2024-02-02 MED ORDER — DULOXETINE HCL 60 MG PO CPEP: 120.0000 mg | ORAL_CAPSULE | Freq: Every day | ORAL | 1 refills | Status: DC

## 2024-02-02 MED ORDER — BUPROPION HCL ER (XL) 150 MG PO TB24: 150.0000 mg | ORAL_TABLET | Freq: Every day | ORAL | 1 refills | Status: DC

## 2024-02-02 MED ORDER — RISPERIDONE 1 MG PO TABS: 1.0000 mg | ORAL_TABLET | Freq: Every day | ORAL | 1 refills | Status: DC

## 2024-02-02 MED ORDER — GABAPENTIN 300 MG PO CAPS: 300.0000 mg | ORAL_CAPSULE | Freq: Two times a day (BID) | ORAL | 1 refills | Status: AC

## 2024-02-02 NOTE — Progress Notes (Signed)
 [facility]  Patient ID: Timothy Hammond,  MRN: [id number]  PCP: Shayne Anes, MD  Date of Evaluation: 02/02/2024 Time spent:20 minutes  Chief Complaint:  Chief Complaint   Anxiety; Depression; Follow-up    HISTORY/CURRENT STATUS: HPI For 6 month med check.  Doing really well. He enjoys playing golf.   Energy and motivation are good.  Work is going well.  Still works 3-4 days week.  No extreme sadness, tearfulness, or feelings of hopelessness.  Sleeps well with Ambien .  If he doesn't take it, he can't sleep. . ADLs and personal hygiene are normal.   Denies any changes in concentration, making decisions, or remembering things.  Cut out sugar a few months ago and has lost 25 #. Anxiety is well controlled. No PA. Xanax  helps.   No mania, delirium, AH/VH.  No SI/HI.  Individual Medical History/ Review of Systems: Changes? :No    Allergies: Patient has no known allergies.  Current Medications:  Current Outpatient Medications:    GEMTESA 75 MG TABS, Take 1 tablet by mouth daily., Disp: , Rfl:    metFORMIN  (GLUCOPHAGE ) 500 MG tablet, Take 500 mg by mouth 2 (two) times daily with a meal., Disp: , Rfl:    NIFEdipine  (PROCARDIA  XL/ADALAT -CC) 90 MG 24 hr tablet, Take 90 mg by mouth daily., Disp: , Rfl:    ALPRAZolam  (XANAX ) 0.5 MG tablet, Take 1-2 tablets (0.5-1 mg total) by mouth 3 (three) times daily as needed for anxiety. Max 5 pills, Disp: 150 tablet, Rfl: 5   buPROPion  (WELLBUTRIN  XL) 150 MG 24 hr tablet, Take 1 tablet (150 mg total) by mouth daily., Disp: 90 tablet, Rfl: 1   busPIRone  (BUSPAR ) 30 MG tablet, Take 1 tablet (30 mg total) by mouth 2 (two) times daily., Disp: 180 tablet, Rfl: 1   DULoxetine  (CYMBALTA ) 60 MG capsule, Take 2 capsules (120 mg total) by mouth daily., Disp: 180 capsule, Rfl: 1   gabapentin  (NEURONTIN ) 300 MG capsule, Take 1 capsule (300 mg total) by mouth 2 (two) times daily with breakfast and lunch., Disp: 180 capsule, Rfl: 1   risperiDONE   (RISPERDAL ) 1 MG tablet, Take 1 tablet (1 mg total) by mouth at bedtime., Disp: 90 tablet, Rfl: 1   zolpidem  (AMBIEN ) 10 MG tablet, Take 1 tablet (10 mg total) by mouth at bedtime as needed. for sleep, Disp: 90 tablet, Rfl: 1 Medication Side Effects: none  Family Medical/ Social History: Changes? No  MENTAL HEALTH EXAM:  There were no vitals taken for this visit.There is no height or weight on file to calculate BMI.  General Appearance: Casual, Well Groomed, and Obese  Eye Contact:  Good  Speech:  Clear and Coherent and Normal Rate  Volume:  Normal  Mood:  Euthymic  Affect:  Congruent  Thought Process:  Goal Directed and Descriptions of Associations: Circumstantial  Orientation:  Full (Time, Place, and Person)  Thought Content: Logical   Suicidal Thoughts:  No  Homicidal Thoughts:  No  Memory:  WNL  Judgement:  Good  Insight:  Good  Psychomotor Activity:  Normal  Concentration:  Concentration: Good  Recall:  Good  Fund of Knowledge: Good  Language: Good  Assets:  Communication Skills Desire for Improvement Financial Resources/Insurance Housing Resilience Transportation Vocational/Educational  ADL's:  Intact  Cognition: WNL  Prognosis:  Good   PCP follows labs  DIAGNOSES:    ICD-10-CM   1. Generalized anxiety disorder  F41.1     2. Recurrent major depression in full remission  F33.42  3. Insomnia, unspecified type  G47.00       Receiving Psychotherapy: No   RECOMMENDATIONS:  PDMP reviewed.  Gabapentin  filled 12/29/2023.  Ambien  filled 11/28/2023.  Xanax  filled 11/28/2023. I provided approximately  20 minutes of face to face time during this encounter, including time spent before and after the visit in records review, medical decision making, counseling pertinent to today's visit, and charting.   Jorrell is doing really well so no changes will be made.  Continue Xanax  0.5 mg, 1-2 p.o. 3 times daily as needed with a maximum of 5 pills daily. Continue Wellbutrin   XL 150 mg, 1 p.o. daily. Continue BuSpar  30 mg, 1 p.o. twice daily. Continue Cymbalta  60 mg, 2 p.o. daily. Continue gabapentin  300 mg, 1 p.o. at breakfast and 1 at lunch. Continue Risperdal  1 mg, 1 p.o. nightly. Continue Ambien  10 mg, 1 p.o. nightly as needed sleep. Return in 6 months.  Verneita Cooks, PA-C

## 2024-02-07 ENCOUNTER — Telehealth: Payer: Self-pay | Admitting: Physician Assistant

## 2024-02-07 NOTE — Telephone Encounter (Signed)
 Pharm called wanting to clarify sig. Says max 6 per day    [phone]

## 2024-02-07 NOTE — Telephone Encounter (Signed)
 Called pharmacy.  Pharmacist had question on alprazolam  Rx. Request info provided and Rx will be filled today.

## 2024-02-12 ENCOUNTER — Other Ambulatory Visit: Payer: Self-pay | Admitting: Physician Assistant

## 2024-02-24 ENCOUNTER — Other Ambulatory Visit: Payer: Self-pay | Admitting: Physician Assistant

## 2024-03-26 DIAGNOSIS — H40033 Anatomical narrow angle, bilateral: Secondary | ICD-10-CM | POA: Diagnosis not present

## 2024-03-26 DIAGNOSIS — E119 Type 2 diabetes mellitus without complications: Secondary | ICD-10-CM | POA: Diagnosis not present

## 2024-04-23 ENCOUNTER — Other Ambulatory Visit: Payer: Self-pay | Admitting: Physician Assistant

## 2024-04-28 ENCOUNTER — Other Ambulatory Visit: Payer: Self-pay

## 2024-04-28 MED ORDER — RISPERIDONE 1 MG PO TABS: 1.0000 mg | ORAL_TABLET | Freq: Every day | ORAL | 1 refills | Status: DC

## 2024-04-28 MED ORDER — BUPROPION HCL ER (XL) 150 MG PO TB24: 150.0000 mg | ORAL_TABLET | Freq: Every day | ORAL | 1 refills | Status: DC

## 2024-04-28 MED ORDER — BUSPIRONE HCL 30 MG PO TABS: 30.0000 mg | ORAL_TABLET | Freq: Two times a day (BID) | ORAL | 1 refills | Status: DC

## 2024-04-28 MED ORDER — DULOXETINE HCL 60 MG PO CPEP: 120.0000 mg | ORAL_CAPSULE | Freq: Every day | ORAL | 1 refills | Status: DC

## 2024-05-07 ENCOUNTER — Telehealth: Payer: Self-pay | Admitting: Physician Assistant

## 2024-05-07 NOTE — Telephone Encounter (Signed)
 Patient called in stating that he recently changed pharmacies. He needs to change it back to original pharmacy as [facility] doesn't have all his prescriptions. Pls sent prescriptions to [facility] [address] [place], [place] Ph: [phone] Appt 4/2

## 2024-05-09 NOTE — Telephone Encounter (Signed)
 Patient had recently requested RF to be sent to [facility] and all those appropriate had been sent, had controlleds that weren't due yet. Called him to verify he wants sent back to [facility] and he said he does, that it was too hard to get things switched to [facility].

## 2024-05-10 MED ORDER — BUPROPION HCL ER (XL) 150 MG PO TB24: 150.0000 mg | ORAL_TABLET | Freq: Every day | ORAL | 1 refills | Status: AC

## 2024-05-10 MED ORDER — BUSPIRONE HCL 30 MG PO TABS: 30.0000 mg | ORAL_TABLET | Freq: Two times a day (BID) | ORAL | 1 refills | Status: AC

## 2024-05-10 MED ORDER — RISPERIDONE 1 MG PO TABS: 1.0000 mg | ORAL_TABLET | Freq: Every day | ORAL | 1 refills | Status: AC

## 2024-05-10 MED ORDER — DULOXETINE HCL 60 MG PO CPEP: 120.0000 mg | ORAL_CAPSULE | Freq: Every day | ORAL | 1 refills | Status: AC

## 2024-05-10 NOTE — Telephone Encounter (Signed)
 Sent noncontrolled scripts back to [facility] order. Will send controlled meds when due.

## 2024-05-10 NOTE — Telephone Encounter (Addendum)
 Timothy Hammond

## 2024-06-04 ENCOUNTER — Telehealth: Payer: Self-pay | Admitting: Physician Assistant

## 2024-06-04 NOTE — Telephone Encounter (Addendum)
 Last RF were sent to [facility], but then he said he couldn't get all meds from [facility] and wanted sent to a [facility] mail order. Will verify that he wants to use [facility] and see what meds he needs sent.

## 2024-06-04 NOTE — Telephone Encounter (Signed)
 Pt called in and LM that his insurance is wanting him to change to using [facility]. He did not say what he neede sent in and I could not reach him by phone.

## 2024-08-02 ENCOUNTER — Ambulatory Visit: Admitting: Physician Assistant
